# Patient Record
Sex: Female | Born: 1950 | Race: White | Hispanic: No | Smoking: Never smoker
Health system: Southern US, Community
[De-identification: ages and names within clinical notes are randomized; demographics above are authoritative.]

## PROBLEM LIST (undated history)

## (undated) DIAGNOSIS — I10 Essential (primary) hypertension: Secondary | ICD-10-CM

## (undated) HISTORY — PX: CHOLECYSTECTOMY: SHX55

## (undated) HISTORY — PX: KNEE SURGERY: SHX244

## (undated) HISTORY — PX: BLADDER SURGERY: SHX569

## (undated) HISTORY — PX: ABDOMINAL HYSTERECTOMY: SHX81

---

## 2020-05-23 ENCOUNTER — Other Ambulatory Visit: Payer: Self-pay

## 2020-05-23 ENCOUNTER — Emergency Department (HOSPITAL_COMMUNITY): Payer: Medicare Other

## 2020-05-23 ENCOUNTER — Inpatient Hospital Stay (HOSPITAL_COMMUNITY)
Admission: EM | Admit: 2020-05-23 | Discharge: 2020-05-28 | DRG: 177 | Disposition: A | Discharge status: Home / Self Care | Payer: Medicare Other | Attending: Family Medicine | Admitting: Family Medicine

## 2020-05-23 ENCOUNTER — Encounter (HOSPITAL_COMMUNITY): Payer: Self-pay | Admitting: Internal Medicine

## 2020-05-23 DIAGNOSIS — I10 Essential (primary) hypertension: Secondary | ICD-10-CM

## 2020-05-23 DIAGNOSIS — U071 COVID-19: Principal | ICD-10-CM

## 2020-05-23 DIAGNOSIS — E871 Hypo-osmolality and hyponatremia: Secondary | ICD-10-CM

## 2020-05-23 DIAGNOSIS — Z66 Do not resuscitate: Secondary | ICD-10-CM | POA: Diagnosis present

## 2020-05-23 DIAGNOSIS — R0602 Shortness of breath: Secondary | ICD-10-CM | POA: Diagnosis not present

## 2020-05-23 DIAGNOSIS — E213 Hyperparathyroidism, unspecified: Secondary | ICD-10-CM | POA: Diagnosis present

## 2020-05-23 DIAGNOSIS — I959 Hypotension, unspecified: Secondary | ICD-10-CM | POA: Diagnosis present

## 2020-05-23 DIAGNOSIS — J9601 Acute respiratory failure with hypoxia: Secondary | ICD-10-CM

## 2020-05-23 DIAGNOSIS — J1282 Pneumonia due to coronavirus disease 2019: Secondary | ICD-10-CM

## 2020-05-23 DIAGNOSIS — Z8249 Family history of ischemic heart disease and other diseases of the circulatory system: Secondary | ICD-10-CM | POA: Diagnosis not present

## 2020-05-23 LAB — CBC WITH DIFFERENTIAL/PLATELET
Abs Immature Granulocytes: 0.07 10*3/uL (ref 0.00–0.07)
Basophils Absolute: 0 10*3/uL (ref 0.0–0.1)
Basophils Relative: 0 %
Eosinophils Absolute: 0 10*3/uL (ref 0.0–0.5)
Eosinophils Relative: 0 %
HCT: 40.5 % (ref 36.0–46.0)
Hemoglobin: 13.1 g/dL (ref 12.0–15.0)
Immature Granulocytes: 1 %
Lymphocytes Relative: 18 %
Lymphs Abs: 1.3 10*3/uL (ref 0.7–4.0)
MCH: 29.3 pg (ref 26.0–34.0)
MCHC: 32.3 g/dL (ref 30.0–36.0)
MCV: 90.6 fL (ref 80.0–100.0)
Monocytes Absolute: 0.2 10*3/uL (ref 0.1–1.0)
Monocytes Relative: 3 %
Neutro Abs: 6 10*3/uL (ref 1.7–7.7)
Neutrophils Relative %: 78 %
Platelets: 164 10*3/uL (ref 150–400)
RBC: 4.47 MIL/uL (ref 3.87–5.11)
RDW: 13.2 % (ref 11.5–15.5)
Smear Review: NORMAL
WBC: 7.6 10*3/uL (ref 4.0–10.5)
nRBC: 0 % (ref 0.0–0.2)

## 2020-05-23 LAB — COMPREHENSIVE METABOLIC PANEL
ALT: 22 U/L (ref 0–44)
AST: 40 U/L (ref 15–41)
Albumin: 2.7 g/dL — ABNORMAL LOW (ref 3.5–5.0)
Alkaline Phosphatase: 65 U/L (ref 38–126)
Anion gap: 11 (ref 5–15)
BUN: 10 mg/dL (ref 8–23)
CO2: 22 mmol/L (ref 22–32)
Calcium: 8.1 mg/dL — ABNORMAL LOW (ref 8.9–10.3)
Chloride: 97 mmol/L — ABNORMAL LOW (ref 98–111)
Creatinine, Ser: 0.47 mg/dL (ref 0.44–1.00)
GFR calc Af Amer: >60 mL/min (ref >60)
GFR calc non Af Amer: >60 mL/min (ref >60)
Glucose, Bld: 107 mg/dL — ABNORMAL HIGH (ref 70–99)
Potassium: 4.1 mmol/L (ref 3.5–5.1)
Sodium: 130 mmol/L — ABNORMAL LOW (ref 135–145)
Total Bilirubin: 0.6 mg/dL (ref 0.3–1.2)
Total Protein: 6.6 g/dL (ref 6.5–8.1)

## 2020-05-23 LAB — D-DIMER, QUANTITATIVE: D-Dimer, Quant: 1.02 ug/mL-FEU — ABNORMAL HIGH (ref 0.00–0.50)

## 2020-05-23 LAB — LACTIC ACID, PLASMA: Lactic Acid, Venous: 0.9 mmol/L (ref 0.5–1.9)

## 2020-05-23 LAB — FERRITIN: Ferritin: 739 ng/mL — ABNORMAL HIGH (ref 11–307)

## 2020-05-23 LAB — SODIUM, URINE, RANDOM: Sodium, Ur: 10 mmol/L

## 2020-05-23 LAB — C-REACTIVE PROTEIN: CRP: 12 mg/dL — ABNORMAL HIGH (ref <1.0)

## 2020-05-23 LAB — OSMOLALITY, URINE: Osmolality, Ur: 118 mOsm/kg — ABNORMAL LOW (ref 300–900)

## 2020-05-23 LAB — PROCALCITONIN: Procalcitonin: 0.17 ng/mL

## 2020-05-23 LAB — CREATININE, URINE, RANDOM: Creatinine, Urine: 15.66 mg/dL

## 2020-05-23 LAB — FIBRINOGEN: Fibrinogen: 450 mg/dL (ref 210–475)

## 2020-05-23 LAB — TRIGLYCERIDES: Triglycerides: 54 mg/dL (ref <150)

## 2020-05-23 LAB — LACTATE DEHYDROGENASE: LDH: 262 U/L — ABNORMAL HIGH (ref 98–192)

## 2020-05-23 LAB — TROPONIN I (HIGH SENSITIVITY): Troponin I (High Sensitivity): 10 ng/L (ref <18)

## 2020-05-23 LAB — SARS CORONAVIRUS 2 BY RT PCR (HOSPITAL ORDER, PERFORMED IN ~~LOC~~ HOSPITAL LAB): SARS Coronavirus 2: POSITIVE — AB

## 2020-05-23 MED ORDER — GUAIFENESIN-DM 100-10 MG/5ML PO SYRP
10.0000 mL | ORAL_SOLUTION | ORAL | Status: DC | PRN
  Administered 2020-05-24 – 2020-05-26 (×3): 10 mL via ORAL
  Filled 2020-05-23 (×3): qty 10

## 2020-05-23 MED ORDER — LISINOPRIL 5 MG PO TABS
5.0000 mg | ORAL_TABLET | Freq: Every day | ORAL | Status: DC
  Administered 2020-05-24 – 2020-05-28 (×5): 5 mg via ORAL
  Filled 2020-05-23 (×7): qty 1

## 2020-05-23 MED ORDER — SODIUM CHLORIDE 0.9 % IV SOLN: 250.0000 mL | INTRAVENOUS | Status: DC | PRN

## 2020-05-23 MED ORDER — DEXAMETHASONE SODIUM PHOSPHATE 10 MG/ML IJ SOLN
10.0000 mg | Freq: Once | INTRAMUSCULAR | Status: AC
  Administered 2020-05-23: 10 mg via INTRAVENOUS
  Filled 2020-05-23: qty 1

## 2020-05-23 MED ORDER — SODIUM CHLORIDE 0.45 % IV SOLN: INTRAVENOUS | Status: DC

## 2020-05-23 MED ORDER — METHYLPREDNISOLONE SODIUM SUCC 40 MG IJ SOLR
0.5000 mg/kg | Freq: Two times a day (BID) | INTRAMUSCULAR | Status: DC
  Administered 2020-05-24: 34 mg via INTRAVENOUS
  Filled 2020-05-23: qty 1

## 2020-05-23 MED ORDER — SODIUM CHLORIDE 0.9% FLUSH: 3.0000 mL | Freq: Two times a day (BID) | INTRAVENOUS | Status: DC

## 2020-05-23 MED ORDER — ENOXAPARIN SODIUM 40 MG/0.4ML ~~LOC~~ SOLN
40.0000 mg | SUBCUTANEOUS | Status: DC
  Administered 2020-05-23 – 2020-05-27 (×5): 40 mg via SUBCUTANEOUS
  Filled 2020-05-23 (×5): qty 0.4

## 2020-05-23 MED ORDER — ONDANSETRON HCL 4 MG/2ML IJ SOLN
4.0000 mg | Freq: Four times a day (QID) | INTRAMUSCULAR | Status: DC | PRN
  Administered 2020-05-24: 4 mg via INTRAVENOUS
  Filled 2020-05-23: qty 2

## 2020-05-23 MED ORDER — SODIUM CHLORIDE 0.9 % IV SOLN
100.0000 mg | Freq: Every day | INTRAVENOUS | Status: AC
  Administered 2020-05-24 – 2020-05-27 (×4): 100 mg via INTRAVENOUS
  Filled 2020-05-23 (×5): qty 20

## 2020-05-23 MED ORDER — SODIUM CHLORIDE 0.9 % IV SOLN
200.0000 mg | Freq: Once | INTRAVENOUS | Status: AC
  Administered 2020-05-23: 200 mg via INTRAVENOUS
  Filled 2020-05-23: qty 40

## 2020-05-23 MED ORDER — ONDANSETRON HCL 4 MG PO TABS: 4.0000 mg | ORAL_TABLET | Freq: Four times a day (QID) | ORAL | Status: DC | PRN

## 2020-05-23 MED ORDER — SODIUM CHLORIDE 0.9% FLUSH
3.0000 mL | Freq: Two times a day (BID) | INTRAVENOUS | Status: DC
  Administered 2020-05-24: 3 mL via INTRAVENOUS

## 2020-05-23 MED ORDER — SODIUM CHLORIDE 0.9 % IV SOLN
1000.0000 mL | INTRAVENOUS | Status: DC
  Administered 2020-05-24: 1000 mL via INTRAVENOUS

## 2020-05-23 MED ORDER — SODIUM CHLORIDE 0.9 % IV SOLN
1000.0000 mL | INTRAVENOUS | Status: DC
  Administered 2020-05-23: 1000 mL via INTRAVENOUS

## 2020-05-23 MED ORDER — ACETAMINOPHEN 325 MG PO TABS
650.0000 mg | ORAL_TABLET | Freq: Four times a day (QID) | ORAL | Status: DC | PRN
  Administered 2020-05-25: 650 mg via ORAL
  Filled 2020-05-23: qty 2

## 2020-05-23 MED ORDER — SODIUM CHLORIDE 0.9% FLUSH: 3.0000 mL | INTRAVENOUS | Status: DC | PRN

## 2020-05-23 MED ORDER — PREDNISONE 20 MG PO TABS: 50.0000 mg | ORAL_TABLET | Freq: Every day | ORAL | Status: DC

## 2020-05-23 NOTE — ED Triage Notes (Signed)
Pt states she was started having Covid s/s last Mon and Wed was dx with Covid.  She received the monoclonal antibody infusion yesterday and today her sats dropped below 90%, so she came in.  Sats of 88% on arrival that increased to 94% on 2L.

## 2020-05-23 NOTE — H&P (Signed)
Margaret Mclaughlin ZOX:[id number] DOB: 1950-12-30 DOA: 05/23/2020     PCP: Oneita Hurt, No   Outpatient Specialists:  NONE   Patient arrived to ER on 05/23/20 at 1410 Referred by Attending Jacalyn Lefevre, MD   Patient coming from: home Lives alone,       Chief Complaint:   Chief Complaint  Patient presents with   Shortness of Breath    Covid pos    HPI: Margaret Mclaughlin is a 69 y.o. female with medical history significant of HTN    Presented with  Started to have URI symptoms 7 days ago, 5 days ago was diagnosed with COVID. Had monoclonal abs infusion yesterday at [place] Today hypoxic down to 85% in AM Reports mild nausea no diarrhea no abdominal pain Infectious risk factors:  Reports   fever, shortness of breath, dry cough,    KNOWN COVID POSITIVE   Has NOt been vaccinated against COVID would be interested when gets better   Initial COVID TEST    POSITIVE,       Lab Results  Component Value Date   SARSCOV2NAA POSITIVE (A) 05/23/2020   Regarding pertinent Chronic problems:     HTN on lisinopril    While in ER: Started on o2 2L now up to mid 90's   Hospitalist was called for admission for COVID pneumonia with acute respiratory failure with hypoxia  The following Work up has been ordered so far:  Orders Placed This Encounter  Procedures   SARS Coronavirus 2 by RT PCR (hospital order, performed in [facility] hospital lab) Nasopharyngeal Nasopharyngeal Swab   Blood Culture (routine x 2)   DG Chest Port 1 View   Lactic acid, plasma   CBC WITH DIFFERENTIAL   Comprehensive metabolic panel   D-dimer, quantitative   Procalcitonin   Lactate dehydrogenase   Ferritin   Triglycerides   Fibrinogen   C-reactive protein   Diet regular Room service appropriate? Yes; Fluid consistency: Thin   Cardiac monitoring   Insert peripheral IV x 2   Initiate Carrier Fluid Protocol   Place surgical mask on patient   Patient to wear surgical mask during transportation     Assess patient for ability to self-prone. If able (can move self in bed, ambulate) and stable (SpO2 and oxygen requirement):   RN/NT - Document specific oxygen requirements in [facility]   Notify EDP if new oxygen requirements escalates > 4L per minute [place]   RN to draw the following extra tubes:   Pharmacy Tech to prioritize PTA Med Rec   remdesivir per pharmacy consult   Consult for Unassigned Medical Admission  ALL PATIENTS BEING ADMITTED/HAVING PROCEDURES NEED COVID-19 SCREENING   Airborne and Contact precautions   Pulse oximetry, continuous   ED EKG 12-Lead   EKG 12-Lead   Following Medications were ordered in ER: Medications  remdesivir 200 mg in sodium chloride 0.9% 250 mL IVPB (has no administration in time range)    Followed by  remdesivir 100 mg in sodium chloride 0.9 % 100 mL IVPB (has no administration in time range)  0.9 %  sodium chloride infusion (has no administration in time range)  dexamethasone (DECADRON) injection 10 mg (10 mg Intravenous Given 05/23/20 1636)    Significant initial  Findings: Abnormal Labs Reviewed  SARS CORONAVIRUS 2 BY RT PCR (HOSPITAL ORDER, PERFORMED IN [place] [facility]) - Abnormal; Notable for the following components:      Result Value   SARS Coronavirus 2 POSITIVE (*)  All other components within normal limits  COMPREHENSIVE METABOLIC PANEL - Abnormal; Notable for the following components:   Sodium 130 (*)    Chloride 97 (*)    Glucose, Bld 107 (*)    Calcium 8.1 (*)    Albumin 2.7 (*)    All other components within normal limits  D-DIMER, QUANTITATIVE (NOT AT [facility]) - Abnormal; Notable for the following components:   D-Dimer, Quant 1.02 (*)    All other components within normal limits  LACTATE DEHYDROGENASE - Abnormal; Notable for the following components:   LDH 262 (*)    All other components within normal limits  FERRITIN - Abnormal; Notable for the following components:   Ferritin 739 (*)    All other components  within normal limits  C-REACTIVE PROTEIN - Abnormal; Notable for the following components:   CRP 12.0 (*)    All other components within normal limits   Otherwise labs showing:    Recent Labs  Lab 05/23/20 1611  NA 130*  K 4.1  CO2 22  GLUCOSE 107*  BUN 10  CREATININE 0.47  CALCIUM 8.1*    Cr  stable,    Lab Results  Component Value Date   CREATININE 0.47 05/23/2020    Recent Labs  Lab 05/23/20 1611  AST 40  ALT 22  ALKPHOS 65  BILITOT 0.6  PROT 6.6  ALBUMIN 2.7*   Lab Results  Component Value Date   CALCIUM 8.1 (L) 05/23/2020      WBC     Component Value Date/Time   WBC 7.6 05/23/2020 1611   LYMPHSABS PENDING 05/23/2020 1611   MONOABS PENDING 05/23/2020 1611   EOSABS PENDING 05/23/2020 1611   BASOSABS PENDING 05/23/2020 1611   Plt: Lab Results  Component Value Date   PLT 164 05/23/2020    Lactic Acid, Venous    Component Value Date/Time   LATICACIDVEN 0.9 05/23/2020 1610     Procalcitonin 0.17   COVID-19 Labs  Recent Labs    05/23/20 1611  DDIMER 1.02*  FERRITIN 739*  LDH 262*  CRP 12.0*    Lab Results  Component Value Date   SARSCOV2NAA POSITIVE (A) 05/23/2020    HG/HCT   stable,      Component Value Date/Time   HGB 13.1 05/23/2020 1611   HCT 40.5 05/23/2020 1611   MCV 90.6 05/23/2020 1611    Troponin 10 Cardiac Panel (last 3 results)   ECG: Ordered Personally reviewed by me showing: HR : 79 Rhythm:   NSR,   no evidence of ischemic changes QTC 459     Ordered    CXR - bilateral infiltrates     ED Triage Vitals  Enc Vitals Group     BP 05/23/20 1600 (!) 138/97     Pulse Rate 05/23/20 1500 78     Resp 05/23/20 1600 (!) 23     Temp 05/23/20 1551 98.6 F (37 C)     Temp Source 05/23/20 1551 Oral     SpO2 05/23/20 1500 93 %     Weight 05/23/20 1552 150 lb (68 kg)     Height 05/23/20 1552 5\' 1"  (1.549 m)     Head Circumference --      Peak Flow --      Pain Score 05/23/20 1552 0     Pain Loc --      Pain  Edu? --      Excl. in GC? --   05/25/20  Latest  Blood pressure (!) 138/97, pulse 77, temperature 98.6 F (37 C), temperature source Oral, resp. rate (!) 23, height 5\' 1"  (1.549 m), weight 68 kg, SpO2 92 %.     Review of Systems:    Pertinent positives include: Fevers, chills, fatigue, shortness of breath at rest.  dyspnea on exertion,  Constitutional:  No weight loss, night sweats weight loss  HEENT:  No headaches, Difficulty swallowing,Tooth/dental problems,Sore throat,  No sneezing, itching, ear ache, nasal congestion, post nasal drip,  Cardio-vascular:  No chest pain, Orthopnea, PND, anasarca, dizziness, palpitations. no Bilateral lower extremity swelling  GI:  No heartburn, indigestion, abdominal pain, nausea, vomiting, diarrhea, change in bowel habits, loss of appetite, melena, blood in stool, hematemesis Resp:  no  No excess mucus, no productive cough, No non-productive cough, No coughing up of blood.No change in color of mucus.No wheezing. Skin:  no rash or lesions. No jaundice GU:  no dysuria, change in color of urine, no urgency or frequency. No straining to urinate.  No flank pain.  Musculoskeletal:  No joint pain or no joint swelling. No decreased range of motion. No back pain.  Psych:  No change in mood or affect. No depression or anxiety. No memory loss.  Neuro: no localizing neurological complaints, no tingling, no weakness, no double vision, no gait abnormality, no slurred speech, no confusion  All systems reviewed and apart from HOPI all are negative  Past Medical History:  History reviewed. No pertinent past medical history.    History reviewed. No pertinent surgical history.  Social History:  Ambulatory  independently      has no history on file for tobacco use, alcohol use, and drug use.   Family History:   Family History  Problem Relation Age of Onset   Hypertension Other     Allergies: Allergies  Allergen Reactions   Gluten Meal  Nausea And Vomiting   Hydrocodone Nausea Only   Oxycodone Nausea Only     Prior to Admission medications   Not on File   Physical Exam: Vitals with BMI 05/23/2020 05/23/2020 05/23/2020  Height - 5\' 1"  -  Weight - 150 lbs -  BMI - 28.36 -  Systolic 138 - -  Diastolic 97 - -  Pulse 77 - 78    1. General:  in No Acute distress   Chronically ill -appearing 2. Psychological: Alert and  Oriented 3. Head/ENT:   Dry Mucous Membranes                          Head Non traumatic, neck supple                           Poor Dentition 4. SKIN:   decreased Skin turgor,  Skin clean Dry and intact no rash 5. Heart: Regular rate and rhythm no  Murmur, no Rub or gallop 6. Lungs:  no wheezes or crackles   7. Abdomen: Soft,  non-tender, Non distended  bowel sounds present 8. Lower extremities: no clubbing, cyanosis, no  edema 9. Neurologically Grossly intact, moving all 4 extremities equally   10. MSK: Normal range of motion   All other LABS:     Recent Labs  Lab 05/23/20 1611  WBC 7.6  NEUTROABS PENDING  HGB 13.1  HCT 40.5  MCV 90.6  PLT 164     Recent Labs  Lab 05/23/20 1611  NA 130*  K 4.1  CL 97*  CO2 22  GLUCOSE 107*  BUN 10  CREATININE 0.47  CALCIUM 8.1*     Recent Labs  Lab 05/23/20 1611  AST 40  ALT 22  ALKPHOS 65  BILITOT 0.6  PROT 6.6  ALBUMIN 2.7*       Cultures: No results found for: SDES, SPECREQUEST, CULT, REPTSTATUS   Radiological Exams on Admission: DG Chest Port 1 View  Result Date: 05/23/2020 CLINICAL DATA:  Shortness of breath.  COVID positive. EXAM: PORTABLE CHEST 1 VIEW COMPARISON:  None. FINDINGS: There are diffuse bilateral ground-glass airspace opacities. No pneumothorax. No significant pleural effusion. There is cardiac enlargement with probable atherosclerotic changes of the thoracic aorta. There is an old healed fracture of the mid right clavicle. There is a calcification in the left upper quadrant favored to represent a calcified and  tortuous splenic artery. IMPRESSION: Diffuse bilateral ground-glass airspace opacities consistent with the patient's history of viral pneumonia. Electronically Signed   By: Katherine Mantlehristopher  Green M.D.   On: 05/23/2020 15:29    Chart has been reviewed   Assessment/Plan  69 y.o. female with medical history significant of HTN  Admitted for COVID PNA  Present on Admission:  COVID-19 virus infection -  FROM HOME   WITH KNOWN HX OF COVID19 ER Novel Corona Virus testing: Ordered 05/23/20 and is   positive  Immunization status: not immunized      CRP, LDH: increased   IL-6 and Ferritin increased   Procalcitonin: low  CXR: hazy bilateral peripheral opacities      -Following work-up initiated:      sputum cultures Ordered 05/23/20, Blood cultures  Ordered 05/23/20,      Following complications noted:   Nausea  decreased PO intake resulting in dehydration - will rehydrate  acute respiratory failure with hypoxia - continue oxygen treatment  Plan of treatment: Admit on Airborn Precautions  -given severity of illness initiate steroids Decadron 6mg  q 24 hours And pharmacy consult for remdesivir -  IF Hypoxia progresses rapidly  or  requiring high nasal flow   and no contraindications will attempt a trial of Baricitinib or Actemra   -  use of baricitinib discussed with patient-they are aware this is under EUA by FDA-she has no history of TB, hepatitis B, diverticulitis.  patient is aware of the risk of opportunistic infection, VTE.  They consent to the use of baricitinib.  - Will follow daily d.dimer - Assess for ability to prone  - Supportive management -Fluid sparing resuscitation  -Provide oxygen as needed currently on 2L SpO2: 93 % - IF d.dimer elvated >5 will increase dose of lovenox    Poor Prognostic factors  69 y.o.  Personal hx of  HTN,  NON-Vaccinated status  ABS neutrophil to lymphocyte ratio >3.5 Some Risk factors for Cytokine storm  CXR GGO Ferritin >250 CRP >4.6    present   Will order Airborne and Contact precautions  Family/ patient prognosis discussion: I have discussed case with the family/ patient  who are aware of their prognosis At this point they would like  to be  DNR/DNI       The treatment plan and use of medications and known side effects were discussed with patient/ family. It was clearly explained that there is no proven definitive treatment for COVID-19 infection yet. Any medications used here are based on case reports/anecdotal data which are not peer-reviewed and has not been studied using randomized control trials.  Complete risks and long-term side effects are unknown,  however in the best clinical judgment they seem to be of some clinical benefit rather than medical risks.  Patient/ family agree with the treatment plan and want to receive these treatments as indicated.       Essential hypertension  - restart lisinopril    Hyperparathyroidism (HCC) -chronic     Hyponatremia - obtain urine lytes, gently rehydrate   Other plan as per orders.  DVT prophylaxis:  Lovenox       Code Status:    Code Status: DNR  DNR/DNI   as per patient   I had personally discussed CODE STATUS with patient    Family Communication:   Family not at  Bedside  plan of care was discussed on the phone with  Daughter,   Disposition Plan:      To home once workup is complete and patient is stable   Following barriers for discharge:                            Electrolytes corrected                                                         Will likely need home health, home O2, set up                          Hypoxia stable                      Consults called: none  Admission status:  ED Disposition    ED Disposition Condition Comment   Admit  Hospital Area[facility] [[id number]]  Level of Care: Telemetry Medical [[id number]]  May admit patient to [facility] or [facility] if equivalent level of care is available:: No  Covid Evaluation: Confirmed  COVID Positive  Diagnosis: Pneumonia due to COVID-19 virus [[id number]]  Admitting Physician: Therisa Doyne [[id number]]  Attending Physician: Therisa Doyne [[id number]]  Estimated length of stay: past midnight tomorrow  Certification:: I certify this patient will need inpatient services for at least 2 midnights       inpatient     I Expect 2 midnight stay secondary to severity of patient's current illness need for inpatient interventions justified by the following:  hemodynamic instability despite optimal treatment (tachycardia *hypotension * tachypnea  Hypoxia )   Severe lab/radiological/exam abnormalities including:    bilateral PNA     That are currently affecting medical management.   I expect  patient to be hospitalized for 2 midnights requiring inpatient medical care.  Patient is at high risk for adverse outcome (such as loss of life or disability) if not treated.  Indication for inpatient stay as follows:    Hemodynamic instability despite maximal medical therapy,    New or worsening hypoxia  Need for IV antivirals , IV steroids    Level of care     Tele indefinitely please discontinue once patient no longer qualifies COVID-19 Labs   Lab Results  Component Value Date   SARSCOV2NAA POSITIVE (A) 05/23/2020     Precautions: admitted as   covid positive Airborne and Contact precautions   PPE: Used by the provider:   P100  eye Goggles,  Gloves  gown  Mekai Wilkinson 05/23/2020, 11:23 PM    [facility]     after 2 AM please page floor coverage PA If 7AM-7PM, please contact the day team taking care of the patient using [email or web address]   Patient was evaluated in the context of the global COVID-19 pandemic, which necessitated consideration that the patient might be at risk for infection with the SARS-CoV-2 virus that causes COVID-19. Institutional protocols and algorithms that pertain to the evaluation of patients at risk for COVID-19 are in a state of  rapid change based on information released by regulatory bodies including the CDC and federal and state organizations. These policies and algorithms were followed during the patient's care.

## 2020-05-23 NOTE — ED Provider Notes (Signed)
[facility] EMERGENCY DEPARTMENT Provider Note   CSN: [id number] Arrival date & time: 05/23/20  1410     History Chief Complaint  Patient presents with  . Shortness of Breath    Covid pos    Margaret Mclaughlin is a 69 y.o. female.  Pt presents to the ED today with sob.  Pt started having sx of Covid on 9/13.  She was tested on 9/15 and was Covid +.  She has not been vaccinated.  She has experienced worsening sob.  Pt has a pulse oximeter at home which showed her O2 sats to be in the mid-80s.  When she arrived here, she had a RA O2 sat at 88%.  Pt placed on 2L which brought her up to mid-90s.  Pt did get the Mab infusion in [place] yesterday.        Pmhx: Past Medical History:  Diagnosis Date  . Hyperparathyroidism (HCC)  . Hypertension      OB History   No obstetric history on file.     History reviewed. No pertinent family history.  Family History  Problem Relation Age of Onset  . Heart disease Sister  . Scoliosis Sister  . Heart disease Brother  . Cancer Brother  . Heart disease Sister  . Heart disease Sister  . Cancer Brother    Social Hx: Never smoker.  No etoh  Home Medications Prior to Admission medications   Not on File  Current Outpatient Medications  Medication Sig Dispense Refill  . Bacillus coagulans (PROBIOTIC, B. COAGULANS,) 10 billion cell CpDR Take by mouth.  . calcium carbonate (TUMS) 200 mg calcium (500 mg) chewable tablet Take 1 tablet by mouth.  [name] CALCIUM/MAGNESIUM/VIT D3 (CALCIUM CARB,CIT-MAG12-VIT D3 ORAL) Take by mouth.  . mv,Ca,min-FA-K1-lycopene-lutn 400-30 mcg Tab Take by mouth.  . prednisoLONE acetate (PRED FORTE) 1 % ophthalmic suspension 1 drop every Monday, Wednesday and Friday in both eyes  . conjugated estrogens (PREMARIN) 0.625 mg/gram vaginal cream 0.5 grams twice a week at bedtime 30 g 3  . lisinopriL (PRINIVIL,ZESTRIL) 5 MG tablet TAKE 1 TABLET BY MOUTH EVERY DAY   Past Surgical History:  Procedure  Laterality Date  . CATARACT EXTRACTION, BILATERAL  . CHOLECYSTECTOMY  . CORNEAL TRANSPLANT Bilateral  . HYSTERECTOMY  . LEG SURGERY  . WRIST SURGERY Left    Allergies    Gluten meal, Hydrocodone, and Oxycodone  Review of Systems   Review of Systems  Constitutional: Positive for fatigue.  Respiratory: Positive for cough and shortness of breath.   All other systems reviewed and are negative.   Physical Exam Updated Vital Signs BP (!) 138/97   Pulse 77   Temp 98.6 F (37 C) (Oral)   Resp (!) 23   Ht 5\' 1"  (1.549 m)   Wt 68 kg   SpO2 92%   BMI 28.34 kg/m   Physical Exam Vitals and nursing note reviewed.  Constitutional:      Appearance: Normal appearance.  HENT:     Head: Normocephalic and atraumatic.     Right Ear: External ear normal.     Left Ear: External ear normal.     Nose: Nose normal.     Mouth/Throat:     Mouth: Mucous membranes are moist.     Pharynx: Oropharynx is clear.  Eyes:     Extraocular Movements: Extraocular movements intact.     Conjunctiva/sclera: Conjunctivae normal.     Pupils: Pupils are equal, round, and reactive to light.  Cardiovascular:  Rate and Rhythm: Normal rate and regular rhythm.     Pulses: Normal pulses.     Heart sounds: Normal heart sounds.  Pulmonary:     Effort: Pulmonary effort is normal.     Breath sounds: Rhonchi present.  Abdominal:     General: Abdomen is flat. Bowel sounds are normal.     Palpations: Abdomen is soft.  Musculoskeletal:        General: Normal range of motion.     Cervical back: Normal range of motion and neck supple.  Skin:    General: Skin is warm.     Capillary Refill: Capillary refill takes less than 2 seconds.  Neurological:     General: No focal deficit present.     Mental Status: She is alert and oriented to person, place, and time.  Psychiatric:        Mood and Affect: Mood normal.        Behavior: Behavior normal.        Thought Content: Thought content normal.        Judgment:  Judgment normal.     ED Results / Procedures / Treatments   Labs (all labs ordered are listed, but only abnormal results are displayed) Labs Reviewed  SARS CORONAVIRUS 2 BY RT PCR (HOSPITAL ORDER, PERFORMED IN [facility]) - Abnormal; Notable for the following components:      Result Value   SARS Coronavirus 2 POSITIVE (*)    All other components within normal limits  COMPREHENSIVE METABOLIC PANEL - Abnormal; Notable for the following components:   Sodium 130 (*)    Chloride 97 (*)    Glucose, Bld 107 (*)    Calcium 8.1 (*)    Albumin 2.7 (*)    All other components within normal limits  D-DIMER, QUANTITATIVE (NOT AT [facility]) - Abnormal; Notable for the following components:   D-Dimer, Quant 1.02 (*)    All other components within normal limits  LACTATE DEHYDROGENASE - Abnormal; Notable for the following components:   LDH 262 (*)    All other components within normal limits  FERRITIN - Abnormal; Notable for the following components:   Ferritin 739 (*)    All other components within normal limits  C-REACTIVE PROTEIN - Abnormal; Notable for the following components:   CRP 12.0 (*)    All other components within normal limits  CULTURE, BLOOD (ROUTINE X 2)  CULTURE, BLOOD (ROUTINE X 2)  LACTIC ACID, PLASMA  CBC WITH DIFFERENTIAL/PLATELET  PROCALCITONIN  TRIGLYCERIDES  FIBRINOGEN  LACTIC ACID, PLASMA  TROPONIN I (HIGH SENSITIVITY)    EKG None  Radiology DG Chest Port 1 View  Result Date: 05/23/2020 CLINICAL DATA:  Shortness of breath.  COVID positive. EXAM: PORTABLE CHEST 1 VIEW COMPARISON:  None. FINDINGS: There are diffuse bilateral ground-glass airspace opacities. No pneumothorax. No significant pleural effusion. There is cardiac enlargement with probable atherosclerotic changes of the thoracic aorta. There is an old healed fracture of the mid right clavicle. There is a calcification in the left upper quadrant favored to represent a calcified and tortuous  splenic artery. IMPRESSION: Diffuse bilateral ground-glass airspace opacities consistent with the patient's history of viral pneumonia. Electronically Signed   By: Katherine Mantle M.D.   On: 05/23/2020 15:29    Procedures Procedures (including critical care time)  Medications Ordered in ED Medications  remdesivir 200 mg in sodium chloride 0.9% 250 mL IVPB (has no administration in time range)    Followed by  remdesivir 100  mg in sodium chloride 0.9 % 100 mL IVPB (has no administration in time range)  0.9 %  sodium chloride infusion (1,000 mLs Intravenous New Bag/Given 05/23/20 1859)  dexamethasone (DECADRON) injection 10 mg (10 mg Intravenous Given 05/23/20 1636)    ED Course  I have reviewed the triage vital signs and the nursing notes.  Pertinent labs & imaging results that were available during my care of the patient were reviewed by me and considered in my medical decision making (see chart for details).    MDM Rules/Calculators/A&P                         Pt is requiring O2 and will need admission.  Pt d/w Dr. Adela Glimpse ([facility]) for admission.  CRITICAL CARE Performed by: Jacalyn Lefevre   Total critical care time: 30 minutes  Critical care time was exclusive of separately billable procedures and treating other patients.  Critical care was necessary to treat or prevent imminent or life-threatening deterioration.  Critical care was time spent personally by me on the following activities: development of treatment plan with patient and/or surrogate as well as nursing, discussions with consultants, evaluation of patient's response to treatment, examination of patient, obtaining history from patient or surrogate, ordering and performing treatments and interventions, ordering and review of laboratory studies, ordering and review of radiographic studies, pulse oximetry and re-evaluation of patient's condition.  Margaret Mclaughlin was evaluated in Emergency Department on 05/23/2020 for  the symptoms described in the history of present illness. She was evaluated in the context of the global COVID-19 pandemic, which necessitated consideration that the patient might be at risk for infection with the SARS-CoV-2 virus that causes COVID-19. Institutional protocols and algorithms that pertain to the evaluation of patients at risk for COVID-19 are in a state of rapid change based on information released by regulatory bodies including the CDC and federal and state organizations. These policies and algorithms were followed during the patient's care in the ED. Final Clinical Impression(s) / ED Diagnoses Final diagnoses:  Pneumonia due to COVID-19 virus  Acute respiratory failure with hypoxia [facility])    Rx / DC Orders ED Discharge Orders    None       Jacalyn Lefevre, MD 05/23/20 1910

## 2020-05-24 ENCOUNTER — Other Ambulatory Visit: Payer: Self-pay

## 2020-05-24 LAB — CBC WITH DIFFERENTIAL/PLATELET
Abs Immature Granulocytes: 0.06 10*3/uL (ref 0.00–0.07)
Basophils Absolute: 0 10*3/uL (ref 0.0–0.1)
Basophils Relative: 0 %
Eosinophils Absolute: 0 10*3/uL (ref 0.0–0.5)
Eosinophils Relative: 0 %
HCT: 42.6 % (ref 36.0–46.0)
Hemoglobin: 13.7 g/dL (ref 12.0–15.0)
Immature Granulocytes: 1 %
Lymphocytes Relative: 21 %
Lymphs Abs: 1.3 10*3/uL (ref 0.7–4.0)
MCH: 29.3 pg (ref 26.0–34.0)
MCHC: 32.2 g/dL (ref 30.0–36.0)
MCV: 91.2 fL (ref 80.0–100.0)
Monocytes Absolute: 0.1 10*3/uL (ref 0.1–1.0)
Monocytes Relative: 2 %
Neutro Abs: 4.9 10*3/uL (ref 1.7–7.7)
Neutrophils Relative %: 76 %
Platelets: 177 10*3/uL (ref 150–400)
RBC: 4.67 MIL/uL (ref 3.87–5.11)
RDW: 13.1 % (ref 11.5–15.5)
WBC: 6.4 10*3/uL (ref 4.0–10.5)
nRBC: 0 % (ref 0.0–0.2)

## 2020-05-24 LAB — COMPREHENSIVE METABOLIC PANEL
ALT: 25 U/L (ref 0–44)
AST: 42 U/L — ABNORMAL HIGH (ref 15–41)
Albumin: 2.6 g/dL — ABNORMAL LOW (ref 3.5–5.0)
Alkaline Phosphatase: 62 U/L (ref 38–126)
Anion gap: 9 (ref 5–15)
BUN: 12 mg/dL (ref 8–23)
CO2: 25 mmol/L (ref 22–32)
Calcium: 8.4 mg/dL — ABNORMAL LOW (ref 8.9–10.3)
Chloride: 103 mmol/L (ref 98–111)
Creatinine, Ser: 0.63 mg/dL (ref 0.44–1.00)
GFR calc Af Amer: >60 mL/min (ref >60)
GFR calc non Af Amer: >60 mL/min (ref >60)
Glucose, Bld: 137 mg/dL — ABNORMAL HIGH (ref 70–99)
Potassium: 4.6 mmol/L (ref 3.5–5.1)
Sodium: 137 mmol/L (ref 135–145)
Total Bilirubin: 0.5 mg/dL (ref 0.3–1.2)
Total Protein: 6.7 g/dL (ref 6.5–8.1)

## 2020-05-24 LAB — PROCALCITONIN: Procalcitonin: 0.11 ng/mL

## 2020-05-24 LAB — FERRITIN: Ferritin: 843 ng/mL — ABNORMAL HIGH (ref 11–307)

## 2020-05-24 LAB — BRAIN NATRIURETIC PEPTIDE: B Natriuretic Peptide: 108.8 pg/mL — ABNORMAL HIGH (ref 0.0–100.0)

## 2020-05-24 LAB — ABO/RH: ABO/RH(D): O POS

## 2020-05-24 LAB — D-DIMER, QUANTITATIVE: D-Dimer, Quant: 0.99 ug/mL-FEU — ABNORMAL HIGH (ref 0.00–0.50)

## 2020-05-24 LAB — C-REACTIVE PROTEIN: CRP: 10.3 mg/dL — ABNORMAL HIGH (ref <1.0)

## 2020-05-24 LAB — GLUCOSE, CAPILLARY: Glucose-Capillary: 131 mg/dL — ABNORMAL HIGH (ref 70–99)

## 2020-05-24 LAB — CBG MONITORING, ED
Glucose-Capillary: 147 mg/dL — ABNORMAL HIGH (ref 70–99)
Glucose-Capillary: 127 mg/dL — ABNORMAL HIGH (ref 70–99)

## 2020-05-24 LAB — TROPONIN I (HIGH SENSITIVITY): Troponin I (High Sensitivity): 12 ng/L (ref <18)

## 2020-05-24 LAB — MAGNESIUM: Magnesium: 2 mg/dL (ref 1.7–2.4)

## 2020-05-24 MED ORDER — BARICITINIB 2 MG PO TABS
4.0000 mg | ORAL_TABLET | Freq: Every day | ORAL | Status: DC
  Administered 2020-05-24 – 2020-05-28 (×5): 4 mg via ORAL
  Filled 2020-05-24 (×5): qty 2

## 2020-05-24 MED ORDER — METHYLPREDNISOLONE SODIUM SUCC 40 MG IJ SOLR
20.0000 mg | Freq: Once | INTRAMUSCULAR | Status: AC
  Administered 2020-05-24: 20 mg via INTRAVENOUS
  Filled 2020-05-24: qty 1

## 2020-05-24 MED ORDER — METHYLPREDNISOLONE SODIUM SUCC 125 MG IJ SOLR
60.0000 mg | Freq: Two times a day (BID) | INTRAMUSCULAR | Status: DC
  Administered 2020-05-24 – 2020-05-28 (×8): 60 mg via INTRAVENOUS
  Filled 2020-05-24 (×8): qty 2

## 2020-05-24 MED ORDER — INSULIN ASPART 100 UNIT/ML ~~LOC~~ SOLN: 0.0000 [IU] | Freq: Every day | SUBCUTANEOUS | Status: DC

## 2020-05-24 MED ORDER — INSULIN ASPART 100 UNIT/ML ~~LOC~~ SOLN
0.0000 [IU] | Freq: Three times a day (TID) | SUBCUTANEOUS | Status: DC
  Administered 2020-05-24 – 2020-05-25 (×5): 1 [IU] via SUBCUTANEOUS
  Administered 2020-05-26: 2 [IU] via SUBCUTANEOUS
  Administered 2020-05-26 – 2020-05-28 (×4): 1 [IU] via SUBCUTANEOUS

## 2020-05-24 MED ORDER — SODIUM CHLORIDE 0.9 % IV SOLN
1000.0000 mL | INTRAVENOUS | Status: AC
  Administered 2020-05-24: 1000 mL via INTRAVENOUS

## 2020-05-24 MED ORDER — LIP MEDEX EX OINT
TOPICAL_OINTMENT | CUTANEOUS | Status: DC | PRN
  Filled 2020-05-24: qty 7

## 2020-05-24 NOTE — Progress Notes (Signed)
PROGRESS NOTE                                                                                                                                                                                                             Patient Demographics:    Margaret Mclaughlin, is a 69 y.o. female, DOB - [birth year], BJY:[id number]  Outpatient Primary MD for the patient is Pcp, No    LOS - 1  Admit date - 05/23/2020    Chief Complaint  Patient presents with   Shortness of Breath    Covid pos       Brief Narrative - Margaret Mclaughlin is a 69 y.o. female with medical history significant of HTN, who is unfortunately non Vaccinated and developed symptoms of illness about 1 weeks ago, got outpt Infusion but did not show any signs of improvement, continued to be more short of breath and came to the ER where she was diagnosed with acute hypoxic respiratory failure due to COVID-19 pneumonia and she was admitted.   Subjective:    [name] today has, No headache, No chest pain, No abdominal pain - No Nausea, No new weakness tingling or numbness, +ve Cough and SOB.   Assessment  & Plan :     1. Acute Hypoxic Resp. Failure due to Acute Covid 19 Viral Pneumonitis during the ongoing 2020 Covid 19 Pandemic - she is unfortunately not vaccinated and has so far incurred moderate to severe parenchymal lung injury due to COVID-19 pneumonia and inflammation, she has been started on high-dose IV steroids along with remdesivir and Baricitinib after appropriate consent.  Overall she is tenuous and would need close monitoring.  Encouraged the patient to sit up in chair in the daytime use I-S and flutter valve for pulmonary toiletry and then prone in bed when at night.  Will advance activity and titrate down oxygen as possible.  Actemra/Baricitinib  off label use - patient was told that if COVID-19 pneumonitis gets worse we might potentially use Actemra off label,  patient denies any known history of active diverticulitis, tuberculosis or hepatitis, understands the risks and benefits and wants to proceed with Actemra treatment if required.   SpO2: 91 % O2 Flow Rate (L/min): 5 L/min  Recent Labs  Lab 05/23/20 1610 05/23/20 1611 05/24/20 0422 05/24/20 0704  WBC  --  7.6  6.4  --   PLT  --  164 177  --   CRP  --  12.0* 10.3*  --   BNP  --   --   --  108.8*  DDIMER  --  1.02* 0.99*  --   PROCALCITON  --  0.17  --  0.11  AST  --  40 42*  --   ALT  --  22 25  --   ALKPHOS  --  65 62  --   BILITOT  --  0.6 0.5  --   ALBUMIN  --  2.7* 2.6*  --   LATICACIDVEN 0.9  --   --   --   SARSCOV2NAA POSITIVE*  --   --   --     2.  Essential hypertension currently on ACE inhibitor we will continue to monitor.   3.  Chronic parathyroid issues per chart, calcium levels unremarkable we will continue to intermittently monitor with outpatient endocrine PCP follow-up as desired.     Condition - Extremely Guarded  Family Communication  :  Daughter Amy v on 05/24/20  Code Status :  Full  Consults  :  None  Procedures  :  None  PUD Prophylaxis : None  Disposition Plan  :    Status is: Inpatient  Remains inpatient appropriate because:IV treatments appropriate due to intensity of illness or inability to take PO   Dispo: The patient is from: Home              Anticipated d/c is to: Home              Anticipated d/c date is: > 3 days              Patient currently is not medically stable to d/c.   DVT Prophylaxis  :  Lovenox   Lab Results  Component Value Date   PLT 177 05/24/2020    Diet :  Diet Order            Diet Heart Room service appropriate? Yes; Fluid consistency: Thin  Diet effective now                  Inpatient Medications  Scheduled Meds:  baricitinib  4 mg Oral Daily   enoxaparin (LOVENOX) injection  40 mg Subcutaneous Q24H   insulin aspart  0-5 Units Subcutaneous QHS   insulin aspart  0-9 Units Subcutaneous  TID WC   lisinopril  5 mg Oral Daily   methylPREDNISolone (SOLU-MEDROL) injection  60 mg Intravenous Q12H   methylPREDNISolone (SOLU-MEDROL) injection  20 mg Intravenous Once   Continuous Infusions:  sodium chloride     remdesivir 100 mg in NS 100 mL 100 mg (05/24/20 0944)   PRN Meds:.acetaminophen, guaiFENesin-dextromethorphan, [DISCONTINUED] ondansetron **OR** ondansetron (ZOFRAN) IV  Antibiotics  :    Anti-infectives (From admission, onward)   Start     Dose/Rate Route Frequency Ordered Stop   05/24/20 1000  remdesivir 100 mg in sodium chloride 0.9 % 100 mL IVPB       "Followed by" Linked Group Details   100 mg 200 mL/hr over 30 Minutes Intravenous Daily 05/23/20 1820 05/28/20 0959   05/23/20 1830  remdesivir 200 mg in sodium chloride 0.9% 250 mL IVPB       "Followed by" Linked Group Details   200 mg 580 mL/hr over 30 Minutes Intravenous Once 05/23/20 1820 05/23/20 2218       Time Spent in minutes  30  Susa RaringPrashant Tyrice Hewitt M.D on 05/24/2020 at 10:15 AM  To page go to [email or web address] - password [email or web address]  [facility] -  Office  [phone]    See all Orders from today for further details    Objective:   Vitals:   05/24/20 0615 05/24/20 0700 05/24/20 0800 05/24/20 0900  BP: 134/89 120/73 (!) 138/101 131/90  Pulse: 77 68 75 80  Resp: (!) 22 (!) 25 (!) 25 (!) 21  Temp:      TempSrc:      SpO2: 92% 92% (!) 85% 91%  Weight:      Height:        Wt Readings from Last 3 Encounters:  05/23/20 68 kg     Intake/Output Summary (Last 24 hours) at 05/24/2020 1015 Last data filed at 05/23/2020 2218 Gross per 24 hour  Intake 250 ml  Output --  Net 250 ml     Physical Exam  Awake Alert, No new F.N deficits, Normal affect [place].AT,PERRAL Supple Neck,No JVD, No cervical lymphadenopathy appriciated.  Symmetrical Chest wall movement, Good air movement bilaterally, CTAB RRR,No Gallops,Rubs or new Murmurs, No Parasternal Heave +ve B.Sounds, Abd Soft, No tenderness, No  organomegaly appriciated, No rebound - guarding or rigidity. No Cyanosis, Clubbing or edema, No new Rash or bruise      Data Review:    CBC Recent Labs  Lab 05/23/20 1611 05/24/20 0422  WBC 7.6 6.4  HGB 13.1 13.7  HCT 40.5 42.6  PLT 164 177  MCV 90.6 91.2  MCH 29.3 29.3  MCHC 32.3 32.2  RDW 13.2 13.1  LYMPHSABS 1.3 1.3  MONOABS 0.2 0.1  EOSABS 0.0 0.0  BASOSABS 0.0 0.0    Recent Labs  Lab 05/23/20 1610 05/23/20 1611 05/24/20 0422 05/24/20 0704  NA  --  130* 137  --   K  --  4.1 4.6  --   CL  --  97* 103  --   CO2  --  22 25  --   GLUCOSE  --  107* 137*  --   BUN  --  10 12  --   CREATININE  --  0.47 0.63  --   CALCIUM  --  8.1* 8.4*  --   AST  --  40 42*  --   ALT  --  22 25  --   ALKPHOS  --  65 62  --   BILITOT  --  0.6 0.5  --   ALBUMIN  --  2.7* 2.6*  --   MG  --   --  2.0  --   CRP  --  12.0* 10.3*  --   DDIMER  --  1.02* 0.99*  --   PROCALCITON  --  0.17  --  0.11  LATICACIDVEN 0.9  --   --   --   BNP  --   --   --  108.8*    ------------------------------------------------------------------------------------------------------------------ Recent Labs    05/23/20 1611  TRIG 54    No results found for: HGBA1C ------------------------------------------------------------------------------------------------------------------ No results for input(s): TSH, T4TOTAL, T3FREE, THYROIDAB in the last 72 hours.  Invalid input(s): FREET3  Cardiac Enzymes No results for input(s): CKMB, TROPONINI, MYOGLOBIN in the last 168 hours.  Invalid input(s): CK ------------------------------------------------------------------------------------------------------------------    Component Value Date/Time   BNP 108.8 (H) 05/24/2020 [id number]    Micro Results Recent Results (from the past 240 hour(s))  SARS Coronavirus 2 by RT PCR (hospital order[facility]) Nasopharyngeal Nasopharyngeal  Swab     Status: Abnormal   Collection Time: 05/23/20   4:10 PM   Specimen: Nasopharyngeal Swab  Result Value Ref Range Status   SARS Coronavirus 2 POSITIVE (A) NEGATIVE Final    Comment: RESULT CALLED TO, READ BACK BY AND VERIFIED WITH: V GLOSSSON RN 05/23/20 AT 1755 SK (NOTE) SARS-CoV-2 target nucleic acids are DETECTED  SARS-CoV-2 RNA is generally detectable in upper respiratory specimens  during the acute phase of infection.  Positive results are indicative  of the presence of the identified virus, but do not rule out bacterial infection or co-infection with other pathogens not detected by the test.  Clinical correlation with patient history and  other diagnostic information is necessary to determine patient infection status.  The expected result is negative.  Fact Sheet for Patients:   BoilerBrush.com.cy   Fact Sheet for Healthcare Providers:   https://pope.com/    This test is not yet approved or cleared by the [place] FDA and  has been authorized for detection and/or diagnosis of SARS-CoV-2 by FDA under an Emergency Use Authorization (EUA).  This EUA will remain in effect (meaning this tes t can be used) for the duration of  the COVID-19 declaration under Section 564(b)(1) of the Act, 21 U.S.C. section 360-bbb-3(b)(1), unless the authorization is terminated or revoked sooner.  Performed at [facility], [address] [place], [place] [address]     Radiology Reports DG Chest [place] 1 View  Result Date: 05/23/2020 CLINICAL DATA:  Shortness of breath.  COVID positive. EXAM: PORTABLE CHEST 1 VIEW COMPARISON:  None. FINDINGS: There are diffuse bilateral ground-glass airspace opacities. No pneumothorax. No significant pleural effusion. There is cardiac enlargement with probable atherosclerotic changes of the thoracic aorta. There is an old healed fracture of the mid right clavicle. There is a calcification in the left upper quadrant favored to represent a calcified and  tortuous splenic artery. IMPRESSION: Diffuse bilateral ground-glass airspace opacities consistent with the patient's history of viral pneumonia. Electronically Signed   By: Katherine Mantle M.D.   On: 05/23/2020 15:29

## 2020-05-24 NOTE — Progress Notes (Signed)
Pt arrived to 2 west room 36. [facility] Pt has IV x 2, wearing 4L O2 via [place]. Pt is A&O x 4. Pt oriented to room and unit. Call bell given to pt.

## 2020-05-24 NOTE — ED Notes (Signed)
Pt's O2 sats noted to be maintaining in mid-80's, RR mid 20's. O2 increased to 4L, RR decreased to <20, O2 sats 90-93%

## 2020-05-24 NOTE — ED Notes (Addendum)
HOB lowered for comfort, lights dimmed & blanket provided. No additional requests/needs expressed at this time

## 2020-05-25 ENCOUNTER — Encounter (HOSPITAL_COMMUNITY): Payer: Self-pay | Admitting: Internal Medicine

## 2020-05-25 LAB — COMPREHENSIVE METABOLIC PANEL
ALT: 26 U/L (ref 0–44)
AST: 42 U/L — ABNORMAL HIGH (ref 15–41)
Albumin: 2.4 g/dL — ABNORMAL LOW (ref 3.5–5.0)
Alkaline Phosphatase: 53 U/L (ref 38–126)
Anion gap: 10 (ref 5–15)
BUN: 17 mg/dL (ref 8–23)
CO2: 24 mmol/L (ref 22–32)
Calcium: 8.3 mg/dL — ABNORMAL LOW (ref 8.9–10.3)
Chloride: 104 mmol/L (ref 98–111)
Creatinine, Ser: 0.64 mg/dL (ref 0.44–1.00)
GFR calc Af Amer: >60 mL/min (ref >60)
GFR calc non Af Amer: >60 mL/min (ref >60)
Glucose, Bld: 141 mg/dL — ABNORMAL HIGH (ref 70–99)
Potassium: 4 mmol/L (ref 3.5–5.1)
Sodium: 138 mmol/L (ref 135–145)
Total Bilirubin: 0.1 mg/dL — ABNORMAL LOW (ref 0.3–1.2)
Total Protein: 6.1 g/dL — ABNORMAL LOW (ref 6.5–8.1)

## 2020-05-25 LAB — CBC WITH DIFFERENTIAL/PLATELET
Abs Immature Granulocytes: 0.07 10*3/uL (ref 0.00–0.07)
Basophils Absolute: 0 10*3/uL (ref 0.0–0.1)
Basophils Relative: 0 %
Eosinophils Absolute: 0 10*3/uL (ref 0.0–0.5)
Eosinophils Relative: 0 %
HCT: 39.6 % (ref 36.0–46.0)
Hemoglobin: 13 g/dL (ref 12.0–15.0)
Immature Granulocytes: 1 %
Lymphocytes Relative: 22 %
Lymphs Abs: 1.9 10*3/uL (ref 0.7–4.0)
MCH: 29.3 pg (ref 26.0–34.0)
MCHC: 32.8 g/dL (ref 30.0–36.0)
MCV: 89.4 fL (ref 80.0–100.0)
Monocytes Absolute: 0.4 10*3/uL (ref 0.1–1.0)
Monocytes Relative: 5 %
Neutro Abs: 6 10*3/uL (ref 1.7–7.7)
Neutrophils Relative %: 72 %
Platelets: 227 10*3/uL (ref 150–400)
RBC: 4.43 MIL/uL (ref 3.87–5.11)
RDW: 13 % (ref 11.5–15.5)
WBC: 8.4 10*3/uL (ref 4.0–10.5)
nRBC: 0 % (ref 0.0–0.2)

## 2020-05-25 LAB — GLUCOSE, CAPILLARY
Glucose-Capillary: 143 mg/dL — ABNORMAL HIGH (ref 70–99)
Glucose-Capillary: 124 mg/dL — ABNORMAL HIGH (ref 70–99)
Glucose-Capillary: 142 mg/dL — ABNORMAL HIGH (ref 70–99)
Glucose-Capillary: 158 mg/dL — ABNORMAL HIGH (ref 70–99)

## 2020-05-25 LAB — HEMOGLOBIN A1C
Hgb A1c MFr Bld: 5.8 % — ABNORMAL HIGH (ref 4.8–5.6)
Mean Plasma Glucose: 119.76 mg/dL

## 2020-05-25 LAB — D-DIMER, QUANTITATIVE: D-Dimer, Quant: 0.84 ug/mL-FEU — ABNORMAL HIGH (ref 0.00–0.50)

## 2020-05-25 LAB — MAGNESIUM: Magnesium: 2.1 mg/dL (ref 1.7–2.4)

## 2020-05-25 LAB — LACTIC ACID, PLASMA: Lactic Acid, Venous: 1.2 mmol/L (ref 0.5–1.9)

## 2020-05-25 LAB — C-REACTIVE PROTEIN: CRP: 2.1 mg/dL — ABNORMAL HIGH (ref <1.0)

## 2020-05-25 LAB — PROCALCITONIN: Procalcitonin: <0.1 ng/mL

## 2020-05-25 LAB — BRAIN NATRIURETIC PEPTIDE: B Natriuretic Peptide: 218.1 pg/mL — ABNORMAL HIGH (ref 0.0–100.0)

## 2020-05-25 NOTE — Evaluation (Signed)
Physical Therapy Evaluation Patient Details Name: Margaret Mclaughlin MRN: [id number] DOB: [birth year] Today's Date: 05/25/2020   History of Present Illness  69 yo female with PMH of HTN presents to ED on 9/20 with covid pneumonitis, pt with moderate to severe parenchymal lung injury. Pt is unvaccinated.  Clinical Impression   Pt presents with mild to moderated dyspnea on exertion, increased time and effort to mobilize, decreased knowledge and application of breathing techniques, and decreased activity tolerance vs baseline. Pt to benefit from acute PT to address deficits. Pt ambulated hallway distance with no AD and close guard for safety, SpO2 ranging from 85-90% on 4LO2 during mobility. Once instructed in breathing technique, pt with excellent breathing and SpO2 recovery >88%. PT anticipates no follow up needs. PT to progress mobility as tolerated, and will continue to follow acutely.      Follow Up Recommendations No PT follow up;Supervision for mobility/OOB    Equipment Recommendations  None recommended by PT    Recommendations for Other Services       Precautions / Restrictions Precautions Precautions: None Precaution Comments: sats Restrictions Weight Bearing Restrictions: No      Mobility  Bed Mobility Overal bed mobility: Needs Assistance Bed Mobility: Sit to Supine       Sit to supine: Supervision;HOB elevated   General bed mobility comments: Up in chair upon PT arrival to room. increased time to return to supine, watching for lines/leads.  Transfers Overall transfer level: Needs assistance   Transfers: Sit to/from Stand Sit to Stand: Supervision         General transfer comment: for safety, no unsteadiness upon standing.  Ambulation/Gait Ambulation/Gait assistance: Min guard Gait Distance (Feet): 200 Feet Assistive device: None Gait Pattern/deviations: Step-through pattern Gait velocity: decr   General Gait Details: min guard for safety, slowed and  cautious gait with mild unsteadiness with direcitonal changes. Standing rest breaks x2 to recover SpO2 (86-90% during mobility) and DOE 2/4. Pt with excellent breathing technique (in through nose, out through mouth, slow deep breaths)  Stairs            Wheelchair Mobility    Modified Rankin (Stroke Patients Only)       Balance Overall balance assessment: Mild deficits observed, not formally tested                                           Pertinent Vitals/Pain Pain Assessment: No/denies pain    Home Living Family/patient expects to be discharged to:: Private residence Living Arrangements: Alone Available Help at Discharge: Family;Available PRN/intermittently Type of Home: House Home Access: Stairs to enter   Entrance Stairs-Number of Steps: 1 Home Layout: One level Home Equipment: Grab bars - tub/shower;Walker - 2 wheels;Cane - single point      Prior Function Level of Independence: Independent         Comments: Pt formally married, husband has since passed. Pt has 3 pigs, enjoys walking on her property.     Hand Dominance   Dominant Hand: Right    Extremity/Trunk Assessment   Upper Extremity Assessment Upper Extremity Assessment: Defer to OT evaluation    Lower Extremity Assessment Lower Extremity Assessment: Overall WFL for tasks assessed    Cervical / Trunk Assessment Cervical / Trunk Assessment: Normal  Communication   Communication: No difficulties  Cognition Arousal/Alertness: Awake/alert Behavior During Therapy: WFL for tasks assessed/performed Overall Cognitive Status:  Within Functional Limits for tasks assessed                                        General Comments General comments (skin integrity, edema, etc.): SpO2 85-90% on 4LO2 during mobility    Exercises     Assessment/Plan    PT Assessment Patient needs continued PT services  PT Problem List Decreased mobility;Decreased activity  tolerance;Decreased balance;Cardiopulmonary status limiting activity       PT Treatment Interventions Therapeutic activities;Gait training;Therapeutic exercise;Patient/family education;Functional mobility training    PT Goals (Current goals can be found in the Care Plan section)  Acute Rehab PT Goals Patient Stated Goal: go home PT Goal Formulation: With patient Time For Goal Achievement: 06/08/20 Potential to Achieve Goals: Good    Frequency Min 3X/week   Barriers to discharge        Co-evaluation               AM-PAC PT "6 Clicks" Mobility  Outcome Measure Help needed turning from your back to your side while in a flat bed without using bedrails?: None Help needed moving from lying on your back to sitting on the side of a flat bed without using bedrails?: None Help needed moving to and from a bed to a chair (including a wheelchair)?: A Little Help needed standing up from a chair using your arms (e.g., wheelchair or bedside chair)?: None Help needed to walk in hospital room?: A Little Help needed climbing 3-5 steps with a railing? : A Little 6 Click Score: 21    End of Session Equipment Utilized During Treatment: Oxygen Activity Tolerance: Patient limited by fatigue Patient left: in bed;with call bell/phone within reach Nurse Communication: Mobility status PT Visit Diagnosis: Other abnormalities of gait and mobility (R26.89)    Time: 1333-1350 PT Time Calculation (min) (ACUTE ONLY): 17 min   Charges:   PT Evaluation $PT Eval Low Complexity: 1 Low          Ryley Teater E, PT Acute Rehabilitation Services Pager [phone]  Office [phone]    [name] [name] 05/25/2020, 4:47 PM

## 2020-05-25 NOTE — Plan of Care (Signed)

## 2020-05-25 NOTE — Progress Notes (Signed)
PROGRESS NOTE    Margaret Mclaughlin  WEX:[id number] DOB: 08-27-1951 DOA: 05/23/2020 PCP: Pcp, No   Chief Complaint  Patient presents with  . Shortness of Breath    Covid pos   Brief Narrative:  Margaret Sultan Smithis Meade Hogeland 69 y.o.femalewith medical history significant of HTN, who is unfortunately non Vaccinated and developed symptoms of illness about 1 weeks ago, got outpt Infusion but did not show any signs of improvement, continued to be more short of breath and came to the ER where she was diagnosed with acute hypoxic respiratory failure due to COVID-19 pneumonia and she was admitted.  Assessment & Plan:   Active Problems:   COVID-19 virus infection   Pneumonia due to COVID-19 virus   Essential hypertension   Hyperparathyroidism (HCC)   Hyponatremia  Acute Hypoxic Respiratory Failure 2/2 COVID 19 Pneumonia:  Unvaccinated CXR 9/20 with diffuse bilateral GGO  Currently on 4 L [place] Continue steroids, baricitinib, remdesivir Strict I/O, daily weights OOB, prone as able, IS, flutter  COVID-19 Labs  Recent Labs    05/23/20 1611 05/24/20 0422 05/25/20 0553  DDIMER 1.02* 0.99* 0.84*  FERRITIN 739* 843*  --   LDH 262*  --   --   CRP 12.0* 10.3* 2.1*    Lab Results  Component Value Date   SARSCOV2NAA POSITIVE (Yvette Roark) 05/23/2020   Elevated BNP Follow echo  Hypertension Continue lisinopril  Hyperparathyroidism Continue to monitor, follow outpatient  DVT prophylaxis: lovenox Code Status: DNR Family Communication: none at bedside - daughter Disposition:   Status is: Inpatient  Remains inpatient appropriate because:Inpatient level of care appropriate due to severity of illness   Dispo: The patient is from: Home              Anticipated d/c is to: pending              Anticipated d/c date is: > 3 days              Patient currently is not medically stable to d/c.   Consultants:   none  Procedures:   none  Antimicrobials:  Anti-infectives (From admission, onward)    Start     Dose/Rate Route Frequency Ordered Stop   05/24/20 1000  remdesivir 100 mg in sodium chloride 0.9 % 100 mL IVPB       "Followed by" Linked Group Details   100 mg 200 mL/hr over 30 Minutes Intravenous Daily 05/23/20 1820 05/28/20 0959   05/23/20 1830  remdesivir 200 mg in sodium chloride 0.9% 250 mL IVPB       "Followed by" Linked Group Details   200 mg 580 mL/hr over 30 Minutes Intravenous Once 05/23/20 1820 05/23/20 2218     Subjective: Doing ok No new complaints today  Objective: Vitals:   05/24/20 2138 05/25/20 0821 05/25/20 1555 05/25/20 1557  BP:  132/76  124/81  Pulse:  68 67 70  Resp:  18    Temp:  98.3 F (36.8 C)  98.9 F (37.2 C)  TempSrc:      SpO2: 93% (!) 89% 92% 90%  Weight:      Height:        Intake/Output Summary (Last 24 hours) at 05/25/2020 1819 Last data filed at 05/25/2020 0934 Gross per 24 hour  Intake 898.22 ml  Output 0 ml  Net 898.22 ml   Filed Weights   05/23/20 1552  Weight: 68 kg    Examination:  General exam: Appears calm and comfortable  Respiratory system: Clear  to auscultation. Respiratory effort normal. Cardiovascular system: S1 & S2 heard, RRR Gastrointestinal system: Abdomen is nondistended, soft and nontender. Central nervous system: Alert and oriented. No focal neurological deficits. Extremities: no lee Skin: No rashes, lesions or ulcers Psychiatry: Judgement and insight appear normal. Mood & affect appropriate.     Data Reviewed: I have personally reviewed following labs and imaging studies  CBC: Recent Labs  Lab 05/23/20 1611 05/24/20 0422 05/25/20 0553  WBC 7.6 6.4 8.4  NEUTROABS 6.0 4.9 6.0  HGB 13.1 13.7 13.0  HCT 40.5 42.6 39.6  MCV 90.6 91.2 89.4  PLT 164 177 227    Basic Metabolic Panel: Recent Labs  Lab 05/23/20 1611 05/24/20 0422 05/25/20 0553  NA 130* 137 138  K 4.1 4.6 4.0  CL 97* 103 104  CO2 22 25 24   GLUCOSE 107* 137* 141*  BUN 10 12 17   CREATININE 0.47 0.63 0.64  CALCIUM  8.1* 8.4* 8.3*  MG  --  2.0 2.1    GFR: Estimated Creatinine Clearance: 56.9 mL/min (by C-G formula based on SCr of 0.64 mg/dL).  Liver Function Tests: Recent Labs  Lab 05/23/20 1611 05/24/20 0422 05/25/20 0553  AST 40 42* 42*  ALT 22 25 26   ALKPHOS 65 62 53  BILITOT 0.6 0.5 0.1*  PROT 6.6 6.7 6.1*  ALBUMIN 2.7* 2.6* 2.4*    CBG: Recent Labs  Lab 05/24/20 1700 05/24/20 2150 05/25/20 0819 05/25/20 1139 05/25/20 1557  GLUCAP 127* 131* 143* 124* 142*     Recent Results (from the past 240 hour(s))  SARS Coronavirus 2 by RT PCR (hospital order, performed in [facility] hospital lab) Nasopharyngeal Nasopharyngeal Swab     Status: Abnormal   Collection Time: 05/23/20  4:10 PM   Specimen: Nasopharyngeal Swab  Result Value Ref Range Status   SARS Coronavirus 2 POSITIVE (Tryone Kille) NEGATIVE Final    Comment: RESULT CALLED TO, READ BACK BY AND VERIFIED WITH: V GLOSSSON RN 05/23/20 AT 1755 SK (NOTE) SARS-CoV-2 target nucleic acids are DETECTED  SARS-CoV-2 RNA is generally detectable in upper respiratory specimens  during the acute phase of infection.  Positive results are indicative  of the presence of the identified virus, but do not rule out bacterial infection or co-infection with other pathogens not detected by the test.  Clinical correlation with patient history and  other diagnostic information is necessary to determine patient infection status.  The expected result is negative.  Fact Sheet for Patients:   [facility]   Fact Sheet for Healthcare Providers:   05/25/20    This test is not yet approved or cleared by the 05/25/20 FDA and  has been authorized for detection and/or diagnosis of SARS-CoV-2 by FDA under an Emergency Use Authorization (EUA).  This EUA will remain in effect (meaning this tes t can be used) for the duration of  the COVID-19 declaration under Section 564(b)(1) of the Act,  21 U.S.C. section 360-bbb-3(b)(1), unless the authorization is terminated or revoked sooner.  Performed at [facility], [address], [place], [facility] [address]   Blood Culture (routine x 2)     Status: None (Preliminary result)   Collection Time: 05/23/20  4:11 PM   Specimen: BLOOD  Result Value Ref Range Status   Specimen Description BLOOD BLOOD RIGHT FOREARM  Final   Special Requests   Final    BOTTLES DRAWN AEROBIC AND ANAEROBIC Blood Culture adequate volume   Culture   Final    NO GROWTH 2  DAYS Performed at [facility], [address] [place], [place] [address]    Report Status PENDING  Incomplete  Blood Culture (routine x 2)     Status: None (Preliminary result)   Collection Time: 05/23/20  4:11 PM   Specimen: BLOOD  Result Value Ref Range Status   Specimen Description BLOOD BLOOD LEFT FOREARM  Final   Special Requests   Final    BOTTLES DRAWN AEROBIC AND ANAEROBIC Blood Culture adequate volume   Culture   Final    NO GROWTH 2 DAYS Performed at [facility], [address], [place], [place] [address]    Report Status PENDING  Incomplete         Radiology Studies: No results found.      Scheduled Meds: . baricitinib  4 mg Oral Daily  . enoxaparin (LOVENOX) injection  40 mg Subcutaneous Q24H  . insulin aspart  0-5 Units Subcutaneous QHS  . insulin aspart  0-9 Units Subcutaneous TID WC  . lisinopril  5 mg Oral Daily  . methylPREDNISolone (SOLU-MEDROL) injection  60 mg Intravenous Q12H   Continuous Infusions: . remdesivir 100 mg in NS 100 mL 100 mg (05/25/20 0934)     LOS: 2 days    Time spent: over 30 min    Lacretia Nicks, MD [facility]   To contact the attending provider between 7A-7P or the covering provider during after hours 7P-7A, please log into the web site [email or web address] and access using universal [place] password for that web site. If you do not have the password, please call the hospital  operator.  05/25/2020, 6:19 PM

## 2020-05-26 ENCOUNTER — Inpatient Hospital Stay (HOSPITAL_COMMUNITY): Payer: Medicare Other

## 2020-05-26 DIAGNOSIS — R0602 Shortness of breath: Secondary | ICD-10-CM

## 2020-05-26 LAB — CBC WITH DIFFERENTIAL/PLATELET
Abs Immature Granulocytes: 0 10*3/uL (ref 0.00–0.07)
Basophils Absolute: 0 10*3/uL (ref 0.0–0.1)
Basophils Relative: 0 %
Eosinophils Absolute: 0 10*3/uL (ref 0.0–0.5)
Eosinophils Relative: 0 %
HCT: 39 % (ref 36.0–46.0)
Hemoglobin: 12.8 g/dL (ref 12.0–15.0)
Lymphocytes Relative: 6 %
Lymphs Abs: 0.5 10*3/uL — ABNORMAL LOW (ref 0.7–4.0)
MCH: 29.6 pg (ref 26.0–34.0)
MCHC: 32.8 g/dL (ref 30.0–36.0)
MCV: 90.3 fL (ref 80.0–100.0)
Monocytes Absolute: 0.2 10*3/uL (ref 0.1–1.0)
Monocytes Relative: 3 %
Neutro Abs: 7.2 10*3/uL (ref 1.7–7.7)
Neutrophils Relative %: 91 %
Platelets: 274 10*3/uL (ref 150–400)
RBC: 4.32 MIL/uL (ref 3.87–5.11)
RDW: 12.9 % (ref 11.5–15.5)
WBC: 7.9 10*3/uL (ref 4.0–10.5)
nRBC: 0 % (ref 0.0–0.2)
nRBC: 0 /100 WBC

## 2020-05-26 LAB — COMPREHENSIVE METABOLIC PANEL
ALT: 30 U/L (ref 0–44)
AST: 46 U/L — ABNORMAL HIGH (ref 15–41)
Albumin: 2.4 g/dL — ABNORMAL LOW (ref 3.5–5.0)
Alkaline Phosphatase: 52 U/L (ref 38–126)
Anion gap: 7 (ref 5–15)
BUN: 20 mg/dL (ref 8–23)
CO2: 26 mmol/L (ref 22–32)
Calcium: 8.2 mg/dL — ABNORMAL LOW (ref 8.9–10.3)
Chloride: 103 mmol/L (ref 98–111)
Creatinine, Ser: 0.57 mg/dL (ref 0.44–1.00)
GFR calc Af Amer: >60 mL/min (ref >60)
GFR calc non Af Amer: >60 mL/min (ref >60)
Glucose, Bld: 161 mg/dL — ABNORMAL HIGH (ref 70–99)
Potassium: 4.5 mmol/L (ref 3.5–5.1)
Sodium: 136 mmol/L (ref 135–145)
Total Bilirubin: 0.4 mg/dL (ref 0.3–1.2)
Total Protein: 5.7 g/dL — ABNORMAL LOW (ref 6.5–8.1)

## 2020-05-26 LAB — GLUCOSE, CAPILLARY
Glucose-Capillary: 141 mg/dL — ABNORMAL HIGH (ref 70–99)
Glucose-Capillary: 141 mg/dL — ABNORMAL HIGH (ref 70–99)
Glucose-Capillary: 182 mg/dL — ABNORMAL HIGH (ref 70–99)
Glucose-Capillary: 114 mg/dL — ABNORMAL HIGH (ref 70–99)

## 2020-05-26 LAB — ECHOCARDIOGRAM LIMITED
Area-P 1/2: 4.39 cm2
Calc EF: 63.5 %
Height: 61 in
MV M vel: 4.56 m/s
MV Peak grad: 83.2 mmHg
S' Lateral: 3.6 cm
Single Plane A2C EF: 62.7 %
Single Plane A4C EF: 63.7 %
Weight: 2400 oz

## 2020-05-26 LAB — MAGNESIUM: Magnesium: 2.1 mg/dL (ref 1.7–2.4)

## 2020-05-26 LAB — C-REACTIVE PROTEIN: CRP: 1 mg/dL — ABNORMAL HIGH (ref <1.0)

## 2020-05-26 LAB — PROCALCITONIN: Procalcitonin: <0.1 ng/mL

## 2020-05-26 LAB — D-DIMER, QUANTITATIVE: D-Dimer, Quant: 1.04 ug/mL-FEU — ABNORMAL HIGH (ref 0.00–0.50)

## 2020-05-26 LAB — BRAIN NATRIURETIC PEPTIDE: B Natriuretic Peptide: 208.2 pg/mL — ABNORMAL HIGH (ref 0.0–100.0)

## 2020-05-26 MED ORDER — FUROSEMIDE 10 MG/ML IJ SOLN
40.0000 mg | Freq: Once | INTRAMUSCULAR | Status: AC
  Administered 2020-05-26: 40 mg via INTRAVENOUS
  Filled 2020-05-26: qty 4

## 2020-05-26 MED ORDER — TRAZODONE HCL 50 MG PO TABS
50.0000 mg | ORAL_TABLET | Freq: Every day | ORAL | Status: DC
  Administered 2020-05-26 – 2020-05-27 (×2): 50 mg via ORAL
  Filled 2020-05-26 (×2): qty 1

## 2020-05-26 NOTE — Evaluation (Signed)
Occupational Therapy Evaluation Patient Details Name: Margaret Mclaughlin MRN: [id number] DOB: November 10, 1950 Today's Date: 05/26/2020    History of Present Illness 69 yo female with PMH of HTN presents to ED on 9/20 with covid pneumonitis, pt with moderate to severe parenchymal lung injury. Pt is unvaccinated.   Clinical Impression   Pt admitted with the above diagnoses and presents with below problem list. Pt will benefit from continued acute OT to address the below listed deficits and maximize independence with basic ADLs prior to d/c home. PTA pt was independent with ADLs, lives alone. Pt is currently setup to supervision with ADLs and functional transfers/mobility. Pt on 4L O2 throughout session with sats 85-88, lowest observed drop to 82. Walked 2 laps in the room, 2 standing rest breaks incorporated 2/2 fatigue and to encourage breathing exercise prior to continuing. Began to discuss some energy conservation strategies to incorporate during  ADLs.      Follow Up Recommendations  Supervision - Intermittent    Equipment Recommendations  None recommended by OT    Recommendations for Other Services       Precautions / Restrictions Precautions Precautions: None Precaution Comments: sats Restrictions Weight Bearing Restrictions: No      Mobility Bed Mobility               General bed mobility comments: up in recliner  Transfers Overall transfer level: Needs assistance   Transfers: Sit to/from Stand Sit to Stand: Supervision         General transfer comment: for safety, no unsteadiness upon standing.    Balance Overall balance assessment: Mild deficits observed, not formally tested                                         ADL either performed or assessed with clinical judgement   ADL Overall ADL's : Needs assistance/impaired Eating/Feeding: Set up;Sitting   Grooming: Wash/dry hands;Supervision/safety;Standing   Upper Body Bathing: Set up;Sitting    Lower Body Bathing: Supervison/ safety;Sit to/from stand   Upper Body Dressing : Set up;Sitting   Lower Body Dressing: Supervision/safety;Sit to/from stand   Toilet Transfer: Supervision/safety   Toileting- Architect and Hygiene: Supervision/safety   Tub/ Engineer, structural: Supervision/safety   Functional mobility during ADLs: Supervision/safety General ADL Comments: Pt completed household distance functional mobility. 2x standing rest breaks (once to rest 2/2 fatigue, second time cued for break and breathing exercise 2/2 O2 sat 82.     Vision         Perception     Praxis      Pertinent Vitals/Pain Pain Assessment: No/denies pain     Hand Dominance Right   Extremity/Trunk Assessment Upper Extremity Assessment Upper Extremity Assessment: Overall WFL for tasks assessed   Lower Extremity Assessment Lower Extremity Assessment: Defer to PT evaluation   Cervical / Trunk Assessment Cervical / Trunk Assessment: Normal   Communication Communication Communication: No difficulties   Cognition Arousal/Alertness: Awake/alert Behavior During Therapy: WFL for tasks assessed/performed Overall Cognitive Status: Within Functional Limits for tasks assessed                                     General Comments  SpO2 85-88 on 4L 1x dropped to 82 after walking a lap in her room. Recovered back to 87-88 at end of session.  Exercises     Shoulder Instructions      Home Living Family/patient expects to be discharged to:: Private residence Living Arrangements: Alone Available Help at Discharge: Family;Available PRN/intermittently Type of Home: House Home Access: Stairs to enter [employer] of Steps: 1   Home Layout: One level     Bathroom Shower/Tub: Chief Strategy Officer: Standard     Home Equipment: Grab bars - tub/shower;Walker - 2 wheels;Cane - single point;Shower seat          Prior Functioning/Environment  Level of Independence: Independent        Comments: Pt formally married, husband has since passed. Pt has 3 pigs, enjoys walking on her property.        OT Problem List: Decreased activity tolerance;Impaired balance (sitting and/or standing);Decreased knowledge of precautions;Decreased knowledge of use of DME or AE;Cardiopulmonary status limiting activity      OT Treatment/Interventions: Self-care/ADL training;Therapeutic exercise;Energy conservation;DME and/or AE instruction;Therapeutic activities;Patient/family education;Balance training    OT Goals(Current goals can be found in the care plan section) Acute Rehab OT Goals Patient Stated Goal: go home OT Goal Formulation: With patient Time For Goal Achievement: 06/09/20 Potential to Achieve Goals: Good ADL Goals Pt Will Perform Grooming: with modified independence;standing Pt Will Perform Upper Body Bathing: Independently;sitting Pt Will Perform Lower Body Bathing: with modified independence;sit to/from stand Pt Will Perform Tub/Shower Transfer: Tub transfer;shower seat;ambulating;Independently  OT Frequency: Min 2X/week   Barriers to D/C:            Co-evaluation              AM-PAC OT "6 Clicks" Daily Activity     Outcome Measure Help from another person eating meals?: None Help from another person taking care of personal grooming?: None Help from another person toileting, which includes using toliet, bedpan, or urinal?: A Little Help from another person bathing (including washing, rinsing, drying)?: A Little Help from another person to put on and taking off regular upper body clothing?: None Help from another person to put on and taking off regular lower body clothing?: None 6 Click Score: 22   End of Session Equipment Utilized During Treatment: Oxygen (4L )  Activity Tolerance: Patient limited by fatigue;Patient tolerated treatment well Patient left: in chair;with call bell/phone within reach  OT Visit  Diagnosis: Unsteadiness on feet (R26.81);Muscle weakness (generalized) (M62.81)                Time: 1207-1229 OT Time Calculation (min): 22 min Charges:  OT General Charges $OT Visit: 1 Visit OT Evaluation $OT Eval Low Complexity: 1 Low  Raynald Kemp, OT Acute Rehabilitation Services Pager: [phone] Office: [phone]   Pilar Grammes 05/26/2020, 12:49 PM

## 2020-05-26 NOTE — Progress Notes (Addendum)
PROGRESS NOTE    Margaret Mclaughlin  ZOX:[id number] DOB: 12/06/1950 DOA: 05/23/2020 PCP: Pcp, No   Chief Complaint  Patient presents with  . Shortness of Breath    Covid pos   Brief Narrative:  Margaret Mclaughlin Margaret Mclaughlin 69 y.o.femalewith medical history significant of HTN, who is unfortunately non Vaccinated and developed symptoms of illness about 1 weeks ago, got outpt Infusion but did not show any signs of improvement, continued to be more short of breath and came to the ER where she was diagnosed with acute hypoxic respiratory failure due to COVID-19 pneumonia and she was admitted.  Assessment & Plan:   Active Problems:   COVID-19 virus infection   Pneumonia due to COVID-19 virus   Essential hypertension   Hyperparathyroidism (HCC)   Hyponatremia  Acute Hypoxic Respiratory Failure 2/2 COVID 19 Pneumonia:  Unvaccinated CXR 9/20 with diffuse bilateral GGO  Currently on 4 L [place], wean as tolerated Continue steroids, baricitinib, remdesivir Strict I/O, daily weights OOB, prone as able, IS, flutter, therapy  COVID-19 Labs  Recent Labs    05/23/20 1611 05/23/20 1611 05/24/20 0422 05/25/20 0553 05/26/20 0214  DDIMER 1.02*   < > 0.99* 0.84* 1.04*  FERRITIN 739*  --  843*  --   --   LDH 262*  --   --   --   --   CRP 12.0*   < > 10.3* 2.1* 1.0*   < > = values in this interval not displayed.    Lab Results  Component Value Date   SARSCOV2NAA POSITIVE (Chriss Mannan) 05/23/2020   Elevated BNP Follow echo -> normal EF, normal LV diastolic parameters (see report)  Hypertension Continue lisinopril  Hyperparathyroidism Continue to monitor, follow outpatient  Insomnia Try trazodone  DVT prophylaxis: lovenox Code Status: DNR Family Communication: none at bedside - daughter 9/23 Disposition:   Status is: Inpatient  Remains inpatient appropriate because:Inpatient level of care appropriate due to severity of illness   Dispo: The patient is from: Home              Anticipated d/c is  to: pending              Anticipated d/c date is: > 3 days              Patient currently is not medically stable to d/c.   Consultants:   none  Procedures:  Echo IMPRESSIONS    1. Left ventricular ejection fraction, by estimation, is 60 to 65%. The  left ventricle has normal function. The left ventricle has no regional  wall motion abnormalities. Left ventricular diastolic parameters were  normal.  2. Right ventricular systolic function is normal. The right ventricular  size is normal. There is normal pulmonary artery systolic pressure.  3. The mitral valve is normal in structure. Trivial mitral valve  regurgitation. No evidence of mitral stenosis.  4. The aortic valve is normal in structure. Aortic valve regurgitation is  not visualized. No aortic stenosis is present.  Antimicrobials:  Anti-infectives (From admission, onward)   Start     Dose/Rate Route Frequency Ordered Stop   05/24/20 1000  remdesivir 100 mg in sodium chloride 0.9 % 100 mL IVPB       "Followed by" Linked Group Details   100 mg 200 mL/hr over 30 Minutes Intravenous Daily 05/23/20 1820 05/28/20 0959   05/23/20 1830  remdesivir 200 mg in sodium chloride 0.9% 250 mL IVPB       "Followed by" Linked Group  Details   200 mg 580 mL/hr over 30 Minutes Intravenous Once 05/23/20 1820 05/23/20 2218     Subjective: No new complaints today  Objective: Vitals:   05/25/20 1555 05/25/20 1557 05/25/20 2227 05/26/20 0852  BP:  124/81 129/83 136/80  Pulse: 67 70 67 71  Resp:   16 20  Temp:  98.9 F (37.2 C) 98.7 F (37.1 C) 98.1 F (36.7 C)  TempSrc:   Oral Oral  SpO2: 92% 90% 96% 90%  Weight:      Height:       No intake or output data in the 24 hours ending 05/26/20 1419 Filed Weights   05/23/20 1552  Weight: 68 kg    Examination:  General: No acute distress. Cardiovascular: Heart sounds show Margaret Mclaughlin regular rate, and rhythm.  Lungs: Clear to auscultation bilaterally, faint basilar crackles  Abdomen:  Soft, nontender, nondistended Neurological: Alert and oriented 3. Moves all extremities 4. Cranial nerves II through XII grossly intact. Skin: Warm and dry. No rashes or lesions. Extremities: No clubbing or cyanosis. No edema.     Data Reviewed: I have personally reviewed following labs and imaging studies  CBC: Recent Labs  Lab 05/23/20 1611 05/24/20 0422 05/25/20 0553 05/26/20 0214  WBC 7.6 6.4 8.4 7.9  NEUTROABS 6.0 4.9 6.0 7.2  HGB 13.1 13.7 13.0 12.8  HCT 40.5 42.6 39.6 39.0  MCV 90.6 91.2 89.4 90.3  PLT 164 177 227 274    Basic Metabolic Panel: Recent Labs  Lab 05/23/20 1611 05/24/20 0422 05/25/20 0553 05/26/20 0214  NA 130* 137 138 136  K 4.1 4.6 4.0 4.5  CL 97* 103 104 103  CO2 GLUCOSE 107* 137* 141* 161*  BUN CREATININE 0.47 0.63 0.64 0.57  CALCIUM 8.1* 8.4* 8.3* 8.2*  MG  --  2.0 2.1 2.1    GFR: Estimated Creatinine Clearance: 56.9 mL/min (by C-G formula based on SCr of 0.57 mg/dL).  Liver Function Tests: Recent Labs  Lab 05/23/20 1611 05/24/20 0422 05/25/20 0553 05/26/20 0214  AST 40 42* 42* 46*  ALT ALKPHOS 65 62 53 52  BILITOT 0.6 0.5 0.1* 0.4  PROT 6.6 6.7 6.1* 5.7*  ALBUMIN 2.7* 2.6* 2.4* 2.4*    CBG: Recent Labs  Lab 05/25/20 1139 05/25/20 1557 05/25/20 2130 05/26/20 0849 05/26/20 1121  GLUCAP 124* 142* 158* 141* 141*     Recent Results (from the past 240 hour(s))  SARS Coronavirus 2 by RT PCR (hospital order, performed in [facility] hospital lab) Nasopharyngeal Nasopharyngeal Swab     Status: Abnormal   Collection Time: 05/23/20  4:10 PM   Specimen: Nasopharyngeal Swab  Result Value Ref Range Status   SARS Coronavirus 2 POSITIVE (Adi Doro) NEGATIVE Final    Comment: RESULT CALLED TO, READ BACK BY AND VERIFIED WITH: V GLOSSSON RN 05/23/20 AT 1755 SK (NOTE) SARS-CoV-2 target nucleic acids are DETECTED  SARS-CoV-2 RNA is generally detectable in upper respiratory specimens  during the  acute phase of infection.  Positive results are indicative  of the presence of the identified virus, but do not rule out bacterial infection or co-infection with other pathogens not detected by the test.  Clinical correlation with patient history and  other diagnostic information is necessary to determine patient infection status.  The expected result is negative.  Fact Sheet for Patients:   BoilerBrush.com.cy   Fact Sheet for Healthcare Providers:   https://pope.com/  This test is not yet approved or cleared by the [place] and  has been authorized for detection and/or diagnosis of SARS-CoV-2 by FDA under an Emergency Use Authorization (EUA).  This EUA will remain in effect (meaning this tes t can be used) for the duration of  the COVID-19 declaration under Section 564(b)(1) of the Act, 21 U.S.C. section 360-bbb-3(b)(1), unless the authorization is terminated or revoked sooner.  Performed at [facility], [address] [place], [place] [address]   Blood Culture (routine x 2)     Status: None (Preliminary result)   Collection Time: 05/23/20  4:11 PM   Specimen: BLOOD  Result Value Ref Range Status   Specimen Description BLOOD BLOOD RIGHT FOREARM  Final   Special Requests   Final    BOTTLES DRAWN AEROBIC AND ANAEROBIC Blood Culture adequate volume   Culture   Final    NO GROWTH 3 DAYS Performed at [facility], [address] [place], [place] [address]    Report Status PENDING  Incomplete  Blood Culture (routine x 2)     Status: None (Preliminary result)   Collection Time: 05/23/20  4:11 PM   Specimen: BLOOD  Result Value Ref Range Status   Specimen Description BLOOD BLOOD LEFT FOREARM  Final   Special Requests   Final    BOTTLES DRAWN AEROBIC AND ANAEROBIC Blood Culture adequate volume   Culture   Final    NO GROWTH 3 DAYS Performed at [facility], [address] [place], [place] [address]      Report Status PENDING  Incomplete         Radiology Studies: ECHOCARDIOGRAM LIMITED  Result Date: 05/26/2020    ECHOCARDIOGRAM LIMITED REPORT   Patient Name:   Margaret Mclaughlin Date of Exam: 05/26/2020 Medical Rec #:  [id number]      Height:       61.0 in Accession #:    [id number]     Weight:       150.0 lb Date of Birth:  [birth year]       BSA:          1.671 m Patient Age:    69 years       BP:           129/83 mmHg Patient Gender: F              HR:           64 bpm. Exam Location:  Inpatient Procedure: Limited Echo, Limited Color Doppler and Cardiac Doppler Indications:    Elevated brain natriuretic peptide (BNP) level [970263]  History:        Patient has no prior history of Echocardiogram examinations.                 Risk Factors:Hypertension and Non-Smoker.  Sonographer:    Renella Cunas RDCS Referring Phys: [phone] Fawnda Vitullo CALDWELL POWELL JR  Sonographer Comments: Covid positive. IMPRESSIONS  1. Left ventricular ejection fraction, by estimation, is 60 to 65%. The left ventricle has normal function. The left ventricle has no regional wall motion abnormalities. Left ventricular diastolic parameters were normal.  2. Right ventricular systolic function is normal. The right ventricular size is normal. There is normal pulmonary artery systolic pressure.  3. The mitral valve is normal in structure. Trivial mitral valve regurgitation. No evidence of mitral stenosis.  4. The aortic valve is normal in structure. Aortic valve regurgitation is not visualized. No  aortic stenosis is present. FINDINGS  Left Ventricle: Left ventricular ejection fraction, by estimation, is 60 to 65%. The left ventricle has normal function. The left ventricle has no regional wall motion abnormalities. The left ventricular internal cavity size was normal in size. There is  no left ventricular hypertrophy. Left ventricular diastolic parameters were normal. Right Ventricle: The right ventricular size is normal. No increase in right ventricular  wall thickness. Right ventricular systolic function is normal. There is normal pulmonary artery systolic pressure. The tricuspid regurgitant velocity is 2.35 m/s, and  with an assumed right atrial pressure of 3 mmHg, the estimated right ventricular systolic pressure is 25.1 mmHg. Pericardium: There is no evidence of pericardial effusion. Mitral Valve: The mitral valve is normal in structure. Trivial mitral valve regurgitation. No evidence of mitral valve stenosis. Tricuspid Valve: The tricuspid valve is grossly normal. Tricuspid valve regurgitation is trivial. Aortic Valve: The aortic valve is normal in structure. Aortic valve regurgitation is not visualized. No aortic stenosis is present. Pulmonic Valve: The pulmonic valve was normal in structure. Pulmonic valve regurgitation is not visualized. No evidence of pulmonic stenosis. LEFT VENTRICLE PLAX 2D LVIDd:         5.00 cm     Diastology LVIDs:         3.60 cm     LV e' medial:    7.62 cm/s LV PW:         0.90 cm     LV E/e' medial:  12.8 LV IVS:        0.90 cm     LV e' lateral:   8.27 cm/s LVOT diam:     2.10 cm     LV E/e' lateral: 11.8 LV SV:         85 LV SV Index:   51 LVOT Area:     3.46 cm  LV Volumes (MOD) LV vol d, MOD A2C: 85.6 ml LV vol d, MOD A4C: 90.3 ml LV vol s, MOD A2C: 31.9 ml LV vol s, MOD A4C: 32.8 ml LV SV MOD A2C:     53.7 ml LV SV MOD A4C:     90.3 ml LV SV MOD BP:      56.4 ml LEFT ATRIUM         Index LA diam:    3.20 cm 1.91 cm/m  AORTIC VALVE LVOT Vmax:   90.70 cm/s LVOT Vmean:  58.200 cm/s LVOT VTI:    0.246 m  AORTA Ao Root diam: 3.10 cm MITRAL VALVE               TRICUSPID VALVE MV Area (PHT): 4.39 cm    TR Peak grad:   22.1 mmHg MV Decel Time: 173 msec    TR Vmax:        235.00 cm/s MR Peak grad: 83.2 mmHg MR Vmax:      456.00 cm/s  SHUNTS MV E velocity: 97.30 cm/s  Systemic VTI:  0.25 m MV Nyshawn Gowdy velocity: 66.40 cm/s  Systemic Diam: 2.10 cm MV E/Taft Worthing ratio:  1.47 Kristeen Miss MD Electronically signed by Kristeen Miss MD Signature  Date/Time: 05/26/2020/10:46:32 AM    Final         Scheduled Meds: . baricitinib  4 mg Oral Daily  . enoxaparin (LOVENOX) injection  40 mg Subcutaneous Q24H  . insulin aspart  0-5 Units Subcutaneous QHS  . insulin aspart  0-9 Units Subcutaneous TID WC  . lisinopril  5 mg Oral Daily  . methylPREDNISolone (SOLU-MEDROL)  injection  60 mg Intravenous Q12H   Continuous Infusions: . remdesivir 100 mg in NS 100 mL 100 mg (05/26/20 0952)     LOS: 3 days    Time spent: over 30 min    Lacretia Nicks, MD [facility]   To contact the attending provider between 7A-7P or the covering provider during after hours 7P-7A, please log into the web site [email or web address] and access using universal [place] password for that web site. If you do not have the password, please call the hospital operator.  05/26/2020, 2:19 PM

## 2020-05-26 NOTE — Progress Notes (Signed)
  Echocardiogram 2D Echocardiogram has been performed.  Margaret Mclaughlin 05/26/2020, 10:12 AM

## 2020-05-27 LAB — GLUCOSE, CAPILLARY
Glucose-Capillary: 123 mg/dL — ABNORMAL HIGH (ref 70–99)
Glucose-Capillary: 128 mg/dL — ABNORMAL HIGH (ref 70–99)
Glucose-Capillary: 145 mg/dL — ABNORMAL HIGH (ref 70–99)
Glucose-Capillary: 156 mg/dL — ABNORMAL HIGH (ref 70–99)

## 2020-05-27 NOTE — Progress Notes (Addendum)
PROGRESS NOTE    Margaret Mclaughlin  ZOX:[id number] DOB: 08/14/1951 DOA: 05/23/2020 PCP: Pcp, No   Chief Complaint  Patient presents with   Shortness of Breath    Covid pos   Brief Narrative:  Margaret Mclaughlin 69 y.o.femalewith medical history significant of HTN, who is unfortunately non Vaccinated and developed symptoms of illness about 1 weeks ago, got outpt Infusion but did not show any signs of improvement, continued to be more short of breath and came to the ER where she was diagnosed with acute hypoxic respiratory failure due to COVID-19 pneumonia and she was admitted.  Assessment & Plan:   Active Problems:   COVID-19 virus infection   Pneumonia due to COVID-19 virus   Essential hypertension   Hyperparathyroidism (HCC)   Hyponatremia  Acute Hypoxic Respiratory Failure 2/2 COVID 19 Pneumonia:  Unvaccinated CXR 9/20 with diffuse bilateral GGO  Currently on 3 L , wean as tolerated Continue steroids, baricitinib, remdesivir Strict I/O, daily weights OOB, prone as able, IS, flutter, therapy  COVID-19 Labs  Recent Labs    05/25/20 0553 05/26/20 0214  DDIMER 0.84* 1.04*  CRP 2.1* 1.0*    Lab Results  Component Value Date   SARSCOV2NAA POSITIVE (Boyd Litaker) 05/23/2020   Elevated BNP Follow echo -> normal EF, normal LV diastolic parameters (see report)  Hypertension Continue lisinopril  Hyperparathyroidism Continue to monitor, follow outpatient  Insomnia Try trazodone  DVT prophylaxis: lovenox Code Status: DNR Family Communication: none at bedside - daughter 9/24 Disposition:   Status is: Inpatient  Remains inpatient appropriate because:Inpatient level of care appropriate due to severity of illness   Dispo: The patient is from: Home              Anticipated d/c is to: pending              Anticipated d/c date is: > 3 days              Patient currently is not medically stable to d/c.   Consultants:   none  Procedures:  Echo IMPRESSIONS    1.  Left ventricular ejection fraction, by estimation, is 60 to 65%. The  left ventricle has normal function. The left ventricle has no regional  wall motion abnormalities. Left ventricular diastolic parameters were  normal.  2. Right ventricular systolic function is normal. The right ventricular  size is normal. There is normal pulmonary artery systolic pressure.  3. The mitral valve is normal in structure. Trivial mitral valve  regurgitation. No evidence of mitral stenosis.  4. The aortic valve is normal in structure. Aortic valve regurgitation is  not visualized. No aortic stenosis is present.  Antimicrobials:  Anti-infectives (From admission, onward)   Start     Dose/Rate Route Frequency Ordered Stop   05/24/20 1000  remdesivir 100 mg in sodium chloride 0.9 % 100 mL IVPB       "Followed by" Linked Group Details   100 mg 200 mL/hr over 30 Minutes Intravenous Daily 05/23/20 1820 05/27/20 1050   05/23/20 1830  remdesivir 200 mg in sodium chloride 0.9% 250 mL IVPB       "Followed by" Linked Group Details   200 mg 580 mL/hr over 30 Minutes Intravenous Once 05/23/20 1820 05/23/20 2218     Subjective: No new complaints Feeling Savaughn Karwowski little better  Objective: Vitals:   05/26/20 0852 05/26/20 1631 05/26/20 2300 05/27/20 0759  BP: 136/80 125/78 125/66 131/70  Pulse: 71 72 (!) 52 61  Resp: 20  20 16 19   Temp: 98.1 F (36.7 C) 98.3 F (36.8 C) 97.9 F (36.6 C) 98.1 F (36.7 C)  TempSrc: Oral  Oral   SpO2: 90% 90% 97% 93%  Weight:      Height:       No intake or output data in the 24 hours ending 05/27/20 1449 Filed Weights   05/23/20 1552  Weight: 68 kg    Examination:  General: No acute distress. Cardiovascular: Heart sounds show Lesleigh Hughson regular rate, and rhythm Lungs: Clear to auscultation bilaterally Abdomen: Soft, nontender, nondistended Neurological: Alert and oriented 3. Moves all extremities 4. Cranial nerves II through XII grossly intact. Skin: Warm and dry. No rashes  or lesions. Extremities: No clubbing or cyanosis. No edema.   Data Reviewed: I have personally reviewed following labs and imaging studies  CBC: Recent Labs  Lab 05/23/20 1611 05/24/20 0422 05/25/20 0553 05/26/20 0214  WBC 7.6 6.4 8.4 7.9  NEUTROABS 6.0 4.9 6.0 7.2  HGB 13.1 13.7 13.0 12.8  HCT 40.5 42.6 39.6 39.0  MCV 90.6 91.2 89.4 90.3  PLT 164 177 227 274    Basic Metabolic Panel: Recent Labs  Lab 05/23/20 1611 05/24/20 0422 05/25/20 0553 05/26/20 0214  NA 130* 137 138 136  K 4.1 4.6 4.0 4.5  CL 97* 103 104 103  CO2 22 25 24 26   GLUCOSE 107* 137* 141* 161*  BUN 10 12 17 20   CREATININE 0.47 0.63 0.64 0.57  CALCIUM 8.1* 8.4* 8.3* 8.2*  MG  --  2.0 2.1 2.1    GFR: Estimated Creatinine Clearance: 56.9 mL/min (by C-G formula based on SCr of 0.57 mg/dL).  Liver Function Tests: Recent Labs  Lab 05/23/20 1611 05/24/20 0422 05/25/20 0553 05/26/20 0214  AST 40 42* 42* 46*  ALT 22 25 26 30   ALKPHOS 65 62 53 52  BILITOT 0.6 0.5 0.1* 0.4  PROT 6.6 6.7 6.1* 5.7*  ALBUMIN 2.7* 2.6* 2.4* 2.4*    CBG: Recent Labs  Lab 05/26/20 1121 05/26/20 1631 05/26/20 2103 05/27/20 0719 05/27/20 1206  GLUCAP 141* 182* 114* 123* 128*     Recent Results (from the past 240 hour(s))  SARS Coronavirus 2 by RT PCR (hospital order, performed in [facility] hospital lab) Nasopharyngeal Nasopharyngeal Swab     Status: Abnormal   Collection Time: 05/23/20  4:10 PM   Specimen: Nasopharyngeal Swab  Result Value Ref Range Status   SARS Coronavirus 2 POSITIVE (Juanya Villavicencio) NEGATIVE Final    Comment: RESULT CALLED TO, READ BACK BY AND VERIFIED WITH: V GLOSSSON RN 05/23/20 AT 1755 SK (NOTE) SARS-CoV-2 target nucleic acids are DETECTED  SARS-CoV-2 RNA is generally detectable in upper respiratory specimens  during the acute phase of infection.  Positive results are indicative  of the presence of the identified virus, but do not rule out bacterial infection or co-infection with other  pathogens not detected by the test.  Clinical correlation with patient history and  other diagnostic information is necessary to determine patient infection status.  The expected result is negative.  Fact Sheet for Patients:   05/29/20   Fact Sheet for Healthcare Providers:   [facility]    This test is not yet approved or cleared by the 05/25/20 FDA and  has been authorized for detection and/or diagnosis of SARS-CoV-2 by FDA under an Emergency Use Authorization (EUA).  This EUA will remain in effect (meaning this tes t can be used) for the duration of  the COVID-19 declaration under Section 564(b)(1)  of the Act, 21 U.S.C. section 360-bbb-3(b)(1), unless the authorization is terminated or revoked sooner.  Performed at [facility], [address] [place], [place] [address]   Blood Culture (routine x 2)     Status: None (Preliminary result)   Collection Time: 05/23/20  4:11 PM   Specimen: BLOOD  Result Value Ref Range Status   Specimen Description BLOOD BLOOD RIGHT FOREARM  Final   Special Requests   Final    BOTTLES DRAWN AEROBIC AND ANAEROBIC Blood Culture adequate volume   Culture   Final    NO GROWTH 3 DAYS Performed at [facility], [address] [place], [place] [address]    Report Status PENDING  Incomplete  Blood Culture (routine x 2)     Status: None (Preliminary result)   Collection Time: 05/23/20  4:11 PM   Specimen: BLOOD  Result Value Ref Range Status   Specimen Description BLOOD BLOOD LEFT FOREARM  Final   Special Requests   Final    BOTTLES DRAWN AEROBIC AND ANAEROBIC Blood Culture adequate volume   Culture   Final    NO GROWTH 3 DAYS Performed at [facility], [address] [place], [place] [address]    Report Status PENDING  Incomplete         Radiology Studies: ECHOCARDIOGRAM LIMITED  Result Date: 05/26/2020    ECHOCARDIOGRAM LIMITED REPORT   Patient Name:    Simon RheinGLENDA D Tith Date of Exam: 05/26/2020 Medical Rec #:  [id number]      Height:       61.0 in Accession #:    [id number]     Weight:       150.0 lb Date of Birth:  03/01/1951       BSA:          1.671 m Patient Age:    69 years       BP:           129/83 mmHg Patient Gender: F              HR:           64 bpm. Exam Location:  Inpatient Procedure: Limited Echo, Limited Color Doppler and Cardiac Doppler Indications:    Elevated brain natriuretic peptide (BNP) level [[id number]][[id number]]  History:        Patient has no prior history of Echocardiogram examinations.                 Risk Factors:Hypertension and Non-Smoker.  Sonographer:    Renella CunasJulia Swaim RDCS Referring Phys: [phone] [id number] Chenell Lozon CALDWELL POWELL JR  Sonographer Comments: Covid positive. IMPRESSIONS  1. Left ventricular ejection fraction, by estimation, is 60 to 65%. The left ventricle has normal function. The left ventricle has no regional wall motion abnormalities. Left ventricular diastolic parameters were normal.  2. Right ventricular systolic function is normal. The right ventricular size is normal. There is normal pulmonary artery systolic pressure.  3. The mitral valve is normal in structure. Trivial mitral valve regurgitation. No evidence of mitral stenosis.  4. The aortic valve is normal in structure. Aortic valve regurgitation is not visualized. No aortic stenosis is present. FINDINGS  Left Ventricle: Left ventricular ejection fraction, by estimation, is 60 to 65%. The left ventricle has normal function. The left ventricle has no regional wall motion abnormalities. The left ventricular internal cavity size was normal in size. There is  no left ventricular hypertrophy. Left ventricular diastolic parameters were normal. Right Ventricle:  The right ventricular size is normal. No increase in right ventricular wall thickness. Right ventricular systolic function is normal. There is normal pulmonary artery systolic pressure. The tricuspid regurgitant velocity is 2.35 m/s, and  with  an assumed right atrial pressure of 3 mmHg, the estimated right ventricular systolic pressure is 25.1 mmHg. Pericardium: There is no evidence of pericardial effusion. Mitral Valve: The mitral valve is normal in structure. Trivial mitral valve regurgitation. No evidence of mitral valve stenosis. Tricuspid Valve: The tricuspid valve is grossly normal. Tricuspid valve regurgitation is trivial. Aortic Valve: The aortic valve is normal in structure. Aortic valve regurgitation is not visualized. No aortic stenosis is present. Pulmonic Valve: The pulmonic valve was normal in structure. Pulmonic valve regurgitation is not visualized. No evidence of pulmonic stenosis. LEFT VENTRICLE PLAX 2D LVIDd:         5.00 cm     Diastology LVIDs:         3.60 cm     LV e' medial:    7.62 cm/s LV PW:         0.90 cm     LV E/e' medial:  12.8 LV IVS:        0.90 cm     LV e' lateral:   8.27 cm/s LVOT diam:     2.10 cm     LV E/e' lateral: 11.8 LV SV:         85 LV SV Index:   51 LVOT Area:     3.46 cm  LV Volumes (MOD) LV vol d, MOD A2C: 85.6 ml LV vol d, MOD A4C: 90.3 ml LV vol s, MOD A2C: 31.9 ml LV vol s, MOD A4C: 32.8 ml LV SV MOD A2C:     53.7 ml LV SV MOD A4C:     90.3 ml LV SV MOD BP:      56.4 ml LEFT ATRIUM         Index LA diam:    3.20 cm 1.91 cm/m  AORTIC VALVE LVOT Vmax:   90.70 cm/s LVOT Vmean:  58.200 cm/s LVOT VTI:    0.246 m  AORTA Ao Root diam: 3.10 cm MITRAL VALVE               TRICUSPID VALVE MV Area (PHT): 4.39 cm    TR Peak grad:   22.1 mmHg MV Decel Time: 173 msec    TR Vmax:        235.00 cm/s MR Peak grad: 83.2 mmHg MR Vmax:      456.00 cm/s  SHUNTS MV E velocity: 97.30 cm/s  Systemic VTI:  0.25 m MV Strummer Canipe velocity: 66.40 cm/s  Systemic Diam: 2.10 cm MV E/Fiora Weill ratio:  1.47 Kristeen Miss MD Electronically signed by Kristeen Miss MD Signature Date/Time: 05/26/2020/10:46:32 AM    Final         Scheduled Meds:  baricitinib  4 mg Oral Daily   enoxaparin (LOVENOX) injection  40 mg Subcutaneous Q24H   insulin  aspart  0-5 Units Subcutaneous QHS   insulin aspart  0-9 Units Subcutaneous TID WC   lisinopril  5 mg Oral Daily   methylPREDNISolone (SOLU-MEDROL) injection  60 mg Intravenous Q12H   traZODone  50 mg Oral QHS   Continuous Infusions:    LOS: 4 days    Time spent: over 30 min    Lacretia Nicks, MD [facility]   To contact the attending provider between 7A-7P or the covering provider during after hours 7P-7A, please  log into the web site [email or web address] and access using universal [place] password for that web site. If you do not have the password, please call the hospital operator.  05/27/2020, 2:49 PM

## 2020-05-27 NOTE — Progress Notes (Signed)
SATURATION QUALIFICATIONS: (This note is used to comply with regulatory documentation for home oxygen)  Patient Saturations on Room Air at Rest = 84%  Patient Saturations on Room Air while Ambulating = unable to assess due to RA O2 at rest  Patient Saturations on 3 Liters of oxygen while Ambulating = 88%  Please briefly explain why patient needs home oxygen: to maintain SpO2 >=88%, maintained SpO2 88-94% on 3LO2 during mobility this day.   Richrd Sox, PT Acute Rehabilitation Services Pager [phone]  Office [phone]

## 2020-05-27 NOTE — Progress Notes (Signed)
Physical Therapy Treatment Patient Details Name: Margaret Mclaughlin MRN: [id number] DOB: 1951/02/25 Today's Date: 05/27/2020    History of Present Illness 69 yo female with PMH of HTN presents to ED on 9/20 with covid pneumonitis, pt with moderate to severe parenchymal lung injury. Pt is unvaccinated.    PT Comments    Pt with improving activity tolerance this day, ambulating 600 ft total with intermittent standing rest breaks to recover dyspnea. Pt with good application of pursed lip breathing technique. Pt required 3LO2 to maintain SpO2 >=88% during mobility. Pt progressing well, no follow up PT needs anticipated.      Follow Up Recommendations  No PT follow up;Supervision - Intermittent     Equipment Recommendations  None recommended by PT    Recommendations for Other Services       Precautions / Restrictions Precautions Precautions: None Precaution Comments: sats Restrictions Weight Bearing Restrictions: No    Mobility  Bed Mobility               General bed mobility comments: up in recliner  Transfers Overall transfer level: Modified independent     Sit to Stand: Modified independent (Device/Increase time)         General transfer comment: Mod I for increased time to rise, pt very cautious.  Ambulation/Gait Ambulation/Gait assistance: Supervision Gait Distance (Feet): 600 Feet (x6 standing rest breaks) Assistive device: None Gait Pattern/deviations: Step-through pattern;Decreased stride length Gait velocity: decr   General Gait Details: supervision for safety, pt cuing for self-regulating standing rest breaks and breathing technique to recover. Spo2 88-94% on 3LO2 during mobility, DOE 2/4.   Stairs             Wheelchair Mobility    Modified Rankin (Stroke Patients Only)       Balance Overall balance assessment: Mild deficits observed, not formally tested   Sitting balance-Leahy Scale: Normal       Standing balance-Leahy Scale:  Good                              Cognition Arousal/Alertness: Awake/alert Behavior During Therapy: WFL for tasks assessed/performed Overall Cognitive Status: Within Functional Limits for tasks assessed                                        Exercises      General Comments        Pertinent Vitals/Pain Pain Assessment: No/denies pain    Home Living                      Prior Function            PT Goals (current goals can now be found in the care plan section) Acute Rehab PT Goals Patient Stated Goal: go home PT Goal Formulation: With patient Time For Goal Achievement: 06/08/20 Potential to Achieve Goals: Good Progress towards PT goals: Progressing toward goals    Frequency    Min 3X/week      PT Plan Current plan remains appropriate    Co-evaluation              AM-PAC PT "6 Clicks" Mobility   Outcome Measure  Help needed turning from your back to your side while in a flat bed without using bedrails?: None Help needed moving from lying on your back to  sitting on the side of a flat bed without using bedrails?: None Help needed moving to and from a bed to a chair (including a wheelchair)?: A Little Help needed standing up from a chair using your arms (e.g., wheelchair or bedside chair)?: None Help needed to walk in hospital room?: A Little Help needed climbing 3-5 steps with a railing? : A Little 6 Click Score: 21    End of Session Equipment Utilized During Treatment: Oxygen Activity Tolerance: Patient tolerated treatment well Patient left: with call bell/phone within reach;in chair Nurse Communication: Mobility status PT Visit Diagnosis: Other abnormalities of gait and mobility (R26.89)     Time: 1541-1600 PT Time Calculation (min) (ACUTE ONLY): 19 min  Charges:  $Gait Training: 8-22 mins                    [name], PT Acute Rehabilitation Services Pager [phone]  Office [phone]    [name]  [name] 05/27/2020, 4:49 PM

## 2020-05-27 NOTE — Progress Notes (Signed)
Patient assessed.  Reviewed LPN documentation, and agree with the findings.  

## 2020-05-28 LAB — CULTURE, BLOOD (ROUTINE X 2)
Culture: NO GROWTH
Culture: NO GROWTH
Special Requests: ADEQUATE
Special Requests: ADEQUATE

## 2020-05-28 LAB — CBC WITH DIFFERENTIAL/PLATELET
Abs Immature Granulocytes: 0.04 10*3/uL (ref 0.00–0.07)
Basophils Absolute: 0 10*3/uL (ref 0.0–0.1)
Basophils Relative: 0 %
Eosinophils Absolute: 0 10*3/uL (ref 0.0–0.5)
Eosinophils Relative: 0 %
HCT: 39.1 % (ref 36.0–46.0)
Hemoglobin: 12.8 g/dL (ref 12.0–15.0)
Immature Granulocytes: 1 %
Lymphocytes Relative: 23 %
Lymphs Abs: 1.5 10*3/uL (ref 0.7–4.0)
MCH: 29.1 pg (ref 26.0–34.0)
MCHC: 32.7 g/dL (ref 30.0–36.0)
MCV: 88.9 fL (ref 80.0–100.0)
Monocytes Absolute: 0.7 10*3/uL (ref 0.1–1.0)
Monocytes Relative: 10 %
Neutro Abs: 4.3 10*3/uL (ref 1.7–7.7)
Neutrophils Relative %: 66 %
Platelets: 291 10*3/uL (ref 150–400)
RBC: 4.4 MIL/uL (ref 3.87–5.11)
RDW: 12.4 % (ref 11.5–15.5)
WBC: 6.5 10*3/uL (ref 4.0–10.5)
nRBC: 0 % (ref 0.0–0.2)

## 2020-05-28 LAB — COMPREHENSIVE METABOLIC PANEL
ALT: 39 U/L (ref 0–44)
AST: 32 U/L (ref 15–41)
Albumin: 2.4 g/dL — ABNORMAL LOW (ref 3.5–5.0)
Alkaline Phosphatase: 52 U/L (ref 38–126)
Anion gap: 5 (ref 5–15)
BUN: 23 mg/dL (ref 8–23)
CO2: 30 mmol/L (ref 22–32)
Calcium: 8.3 mg/dL — ABNORMAL LOW (ref 8.9–10.3)
Chloride: 101 mmol/L (ref 98–111)
Creatinine, Ser: 0.62 mg/dL (ref 0.44–1.00)
GFR calc Af Amer: >60 mL/min (ref >60)
GFR calc non Af Amer: >60 mL/min (ref >60)
Glucose, Bld: 118 mg/dL — ABNORMAL HIGH (ref 70–99)
Potassium: 4.2 mmol/L (ref 3.5–5.1)
Sodium: 136 mmol/L (ref 135–145)
Total Bilirubin: 0.6 mg/dL (ref 0.3–1.2)
Total Protein: 5.6 g/dL — ABNORMAL LOW (ref 6.5–8.1)

## 2020-05-28 LAB — GLUCOSE, CAPILLARY
Glucose-Capillary: 97 mg/dL (ref 70–99)
Glucose-Capillary: 124 mg/dL — ABNORMAL HIGH (ref 70–99)

## 2020-05-28 LAB — BRAIN NATRIURETIC PEPTIDE: B Natriuretic Peptide: 144.7 pg/mL — ABNORMAL HIGH (ref 0.0–100.0)

## 2020-05-28 LAB — PROCALCITONIN: Procalcitonin: <0.1 ng/mL

## 2020-05-28 LAB — MAGNESIUM: Magnesium: 2.4 mg/dL (ref 1.7–2.4)

## 2020-05-28 LAB — D-DIMER, QUANTITATIVE: D-Dimer, Quant: 0.57 ug/mL-FEU — ABNORMAL HIGH (ref 0.00–0.50)

## 2020-05-28 LAB — C-REACTIVE PROTEIN: CRP: 0.5 mg/dL (ref <1.0)

## 2020-05-28 MED ORDER — PREDNISONE 10 MG PO TABS: ORAL_TABLET | ORAL | 0 refills | Status: AC

## 2020-05-28 NOTE — TOC Transition Note (Signed)
Transition of Care [facility]) - CM/SW Discharge Note   Patient Details  Name: Margaret Mclaughlin MRN: [id number] Date of Birth: [birth year]  [facility] CM/SW Contact:  Kermit Balo, RN Phone Number: 05/28/2020, 3:09 PM   Clinical Narrative:    Pt discharging home with self care. Pt with orders for home oxygen. CM spoke with pts daughter (unable to get pt on the phone) and [employer] selected for DME. Zack with [employer] notified and will have oxygen delivered to the room and home.  Daughter to provide transport home.    Final next level of care: Home/Self Care Barriers to Discharge: No Barriers Identified   Patient Goals and CMS Choice     Choice offered to / list presented to : Adult Children  Discharge Placement                       Discharge Plan and Services                DME Arranged: Oxygen DME Agency: [employer] Date DME Agency Contacted: 05/28/20   Representative spoke with at DME Agency: Zack            Social Determinants of Health (SDOH) Interventions     Readmission Risk Interventions No flowsheet data found.

## 2020-05-28 NOTE — Progress Notes (Signed)
Saturation qualifications for home oxygen: Patient Saturations on Room Air at Rest = 95-94%  Patient Saturations on [employer] while Ambulating = pt felt fine with no complaints of shortness of breath but she dropped as low as 87 on RA and had to be place back on 2L to substrain at least 93% during ambulation.

## 2020-05-28 NOTE — Discharge Instructions (Signed)
Person Under Monitoring Name: Margaret Mclaughlin  Location: [address] [place] [address]   Infection Prevention Recommendations for Individuals Confirmed to have, or Being Evaluated for, 2019 Novel Coronavirus (COVID-19) Infection Who Receive Care at Home  Individuals who are confirmed to have, or are being evaluated for, COVID-19 should follow the prevention steps below until [place] healthcare provider or local or state health department says they can return to normal activities.  Stay home except to get medical care You should restrict activities outside your home, except for getting medical care. Do not go to work, school, or public areas, and do not use public transportation or taxis.  Call ahead before visiting your doctor Before your medical appointment, call the healthcare provider and tell them that you have, or are being evaluated for, COVID-19 infection. This will help the healthcare provider's office take steps to keep other people from getting infected. Ask your healthcare provider to call the local or state health department.  Monitor your symptoms Seek prompt medical attention if your illness is worsening (e.g., difficulty breathing). Before going to your medical appointment, call the healthcare provider and tell them that you have, or are being evaluated for, COVID-19 infection. Ask your healthcare provider to call the local or state health department.  Wear Noura Purpura facemask You should wear Yennifer Segovia facemask that covers your nose and mouth when you are in the same room with other people and when you visit Mickenzie Stolar healthcare provider. People who live with or visit you should also wear Lacheryl Niesen facemask while they are in the same room with you.  Separate yourself from other people in your home As much as possible, you should stay in Casmere Hollenbeck different room from other people in your home. Also, you should use Sydell Prowell separate bathroom, if available.  Avoid sharing household items You should not  share dishes, drinking glasses, cups, eating utensils, towels, bedding, or other items with other people in your home. After using these items, you should wash them thoroughly with soap and water.  Cover your coughs and sneezes Cover your mouth and nose with Manpreet Kemmer tissue when you cough or sneeze, or you can cough or sneeze into your sleeve. Throw used tissues in Yatzary Merriweather lined trash can, and immediately wash your hands with soap and water for at least 20 seconds or use an alcohol-based hand rub.  Wash your [employer] your hands often and thoroughly with soap and water for at least 20 seconds. You can use an alcohol-based hand sanitizer if soap and water are not available and if your hands are not visibly dirty. Avoid touching your eyes, nose, and mouth with unwashed hands.   Prevention Steps for Caregivers and Household Members of Individuals Confirmed to have, or Being Evaluated for, COVID-19 Infection Being Cared for in the Home  If you live with, or provide care at home for, [place] person confirmed to have, or being evaluated for, COVID-19 infection please follow these guidelines to prevent infection:  Follow healthcare provider's instructions Make sure that you understand and can help the patient follow any healthcare provider instructions for all care.  Provide for the patient's basic needs You should help the patient with basic needs in the home and provide support for getting groceries, prescriptions, and other personal needs.  Monitor the patient's symptoms If they are getting sicker, call his or her medical provider and tell them that the patient has, or is being evaluated for, COVID-19 infection. This will help the healthcare provider's  office take steps to keep other people from getting infected. Ask the healthcare provider to call the local or state health department.  Limit the number of people who have contact with the patient  If possible, have only one caregiver for the  patient.  Other household members should stay in another home or place of residence. If this is not possible, they should stay  in another room, or be separated from the patient as much as possible. Use Rihana Kiddy separate bathroom, if available.  Restrict visitors who do not have an essential need to be in the home.  Keep older adults, very young children, and other sick people away from the patient Keep older adults, very young children, and those who have compromised immune systems or chronic health conditions away from the patient. This includes people with chronic heart, lung, or kidney conditions, diabetes, and cancer.  Ensure good ventilation Make sure that shared spaces in the home have good air flow, such as from an air conditioner or an opened window, weather permitting.  Wash your hands often  Wash your hands often and thoroughly with soap and water for at least 20 seconds. You can use an alcohol based hand sanitizer if soap and water are not available and if your hands are not visibly dirty.  Avoid touching your eyes, nose, and mouth with unwashed hands.  Use disposable paper towels to dry your hands. If not available, use dedicated cloth towels and replace them when they become wet.  Wear Cherice Glennie facemask and gloves  Wear Keirstyn Aydt disposable facemask at all times in the room and gloves when you touch or have contact with the patient's blood, body fluids, and/or secretions or excretions, such as sweat, saliva, sputum, nasal mucus, vomit, urine, or feces.  Ensure the mask fits over your nose and mouth tightly, and do not touch it during use.  Throw out disposable facemasks and gloves after using them. Do not reuse.  Wash your hands immediately after removing your facemask and gloves.  If your personal clothing becomes contaminated, carefully remove clothing and launder. Wash your hands after handling contaminated clothing.  Place all used disposable facemasks, gloves, and other waste in Zaira Iacovelli lined  container before disposing them with other household waste.  Remove gloves and wash your hands immediately after handling these items.  Do not share dishes, glasses, or other household items with the patient  Avoid sharing household items. You should not share dishes, drinking glasses, cups, eating utensils, towels, bedding, or other items with Chaney Ingram patient who is confirmed to have, or being evaluated for, COVID-19 infection.  After the person uses these items, you should wash them thoroughly with soap and water.  Wash laundry thoroughly  Immediately remove and wash clothes or bedding that have blood, body fluids, and/or secretions or excretions, such as sweat, saliva, sputum, nasal mucus, vomit, urine, or feces, on them.  Wear gloves when handling laundry from the patient.  Read and follow directions on labels of laundry or clothing items and detergent. In general, wash and dry with the warmest temperatures recommended on the label.  Clean all areas the individual has used often  Clean all touchable surfaces, such as counters, tabletops, doorknobs, bathroom fixtures, toilets, phones, keyboards, tablets, and bedside tables, every day. Also, clean any surfaces that may have blood, body fluids, and/or secretions or excretions on them.  Wear gloves when cleaning surfaces the patient has come in contact with.  Use Rebel Laughridge diluted bleach solution (e.g., dilute bleach with 1  part bleach and 10 parts water) or Talbot Monarch household disinfectant with Elva Mauro label that says EPA-registered for coronaviruses. To make Willena Jeancharles bleach solution at home, add 1 tablespoon of bleach to 1 quart (4 cups) of water. For Janiel Crisostomo larger supply, add  cup of bleach to 1 gallon (16 cups) of water.  Read labels of cleaning products and follow recommendations provided on product labels. Labels contain instructions for safe and effective use of the cleaning product including precautions you should take when applying the product, such as wearing gloves or  eye protection and making sure you have good ventilation during use of the product.  Remove gloves and wash hands immediately after cleaning.  Monitor yourself for signs and symptoms of illness Caregivers and household members are considered close contacts, should monitor their health, and will be asked to limit movement outside of the home to the extent possible. Follow the monitoring steps for close contacts listed on the symptom monitoring form.   ? If you have additional questions, contact your local health department or call the epidemiologist on call at [phone] (available 24/7). ? This guidance is subject to change. For the most up-to-date guidance from [facility], please refer to their website: [email or web address]

## 2020-05-28 NOTE — Discharge Summary (Signed)
Physician Discharge Summary  DARRION MACAULAY ERD:[id number] DOB: 1951/05/04 DOA: 05/23/2020  PCP: Pcp, No  Admit date: 05/23/2020 Discharge date: 05/28/2020  Time spent: 40 minutes  Recommendations for Outpatient Follow-up:  1. Follow up outpatient CMP/CBC  2. Follow outpatient CXR 3. Quarantine per [employer] guidelines (daughter notes first positive test was 9/15, she can discontinue her quarantine after 06/08/2020 if symptoms continue to improve) 4. Wean oxygen as tolerated outpatient with PCP  Discharge Diagnoses:  Active Problems:   COVID-19 virus infection   Pneumonia due to COVID-19 virus   Essential hypertension   Hyperparathyroidism (HCC)   Hyponatremia   Discharge Condition: stable  Diet recommendation: heart healthy  Filed Weights   05/23/20 1552  Weight: 68 kg   History of present illness:  Elisa D Smithis Facundo Allemand 69 y.o.femalewith medical history significant of HTN, who is unfortunately non Vaccinated and developed symptoms of illness about 1 weeks ago, got outpt Infusion but did not showany signs of improvement, continued to be more short of breath and came to the ER where she was diagnosed with acute hypoxic respiratory failure due to COVID-19 pneumonia and she was admitted.  She was admitted for COVID 19 pneumonia.  She received mab prior to admission, but worsened.  She's improved here on steroids, remdesivir, and baricitinib.  She'll discharge today with steroid taper and supplemental oxygen to use with activity.    See below for additional details  Hospital Course:  Acute Hypoxic Respiratory Failure 2/2 COVID 19 Pneumonia:  Unvaccinated CXR 9/20 with diffuse bilateral GGO  On RA at rest, requiring 2 L with activity Remdesivir 5 day course complete.  D/c baricitinib at discharge.  Discharge with steroid taper. Strict I/O, daily weights OOB, prone as able, IS, flutter, therapy  COVID-19 Labs  Recent Labs    05/26/20 0214 05/28/20 0416  DDIMER 1.04* 0.57*  CRP  1.0* 0.5    Lab Results  Component Value Date   SARSCOV2NAA POSITIVE (Malley Hauter) 05/23/2020   Elevated BNP Follow echo -> normal EF, normal LV diastolic parameters (see report)  Hypertension Continue lisinopril  Hyperparathyroidism Continue to monitor, follow outpatient  Insomnia Try trazodone  Procedures: Echo IMPRESSIONS    1. Left ventricular ejection fraction, by estimation, is 60 to 65%. The  left ventricle has normal function. The left ventricle has no regional  wall motion abnormalities. Left ventricular diastolic parameters were  normal.  2. Right ventricular systolic function is normal. The right ventricular  size is normal. There is normal pulmonary artery systolic pressure.  3. The mitral valve is normal in structure. Trivial mitral valve  regurgitation. No evidence of mitral stenosis.  4. The aortic valve is normal in structure. Aortic valve regurgitation is  not visualized. No aortic stenosis is present.   Consultations:  none  Discharge Exam: Vitals:   05/27/20 2242 05/28/20 0816  BP: 137/83 118/80  Pulse:  66  Resp:  16  Temp: 98.2 F (36.8 C) 98 F (36.7 C)  SpO2: 97% (!) 89%   Feels better Breathing feels at baseline Discussed poc with daughter  General: No acute distress. Cardiovascular: Heart sounds show Annalaura Sauseda regular rate, and rhythm.  Lungs: Clear to auscultation bilaterally  Abdomen: Soft, nontender, nondistended Neurological: Alert and oriented 3. Moves all extremities 4 . Cranial nerves II through XII grossly intact. Skin: Warm and dry. No rashes or lesions. Extremities: No clubbing or cyanosis. No edema  Discharge Instructions   Discharge Instructions    Call MD for:  difficulty breathing, headache  or visual disturbances   Complete by: As directed    Call MD for:  extreme fatigue   Complete by: As directed    Call MD for:  hives   Complete by: As directed    Call MD for:  persistant dizziness or light-headedness   Complete  by: As directed    Call MD for:  persistant nausea and vomiting   Complete by: As directed    Call MD for:  redness, tenderness, or signs of infection (pain, swelling, redness, odor or green/yellow discharge around incision site)   Complete by: As directed    Call MD for:  severe uncontrolled pain   Complete by: As directed    Call MD for:  temperature >100.4   Complete by: As directed    Diet - low sodium heart healthy   Complete by: As directed    Discharge instructions   Complete by: As directed    You were seen for COVID 19 pneumonia.  You've improved with steroids, remdesivir, and baricitinib.  We'll send you home with Terreon Ekholm steroid taper.  You need oxygen when your active, also use this at night when you're sleeping.  At rest, you can be on room air (use 2 L with activity and during sleep).  You need to continue to quarantine until 21 days after your first positive test (this will be 06/08/2020, you can discontinue your quarantine after this date as long as you've continued to improve.  Return for new, recurrent, or worsening symptoms.  Please ask your PCP to request records from this hospitalization so they know what was done and what the next steps will be.   Increase activity slowly   Complete by: As directed      Allergies as of 05/28/2020      Reactions   Hydrocodone-acetaminophen Nausea And Vomiting   Oxycodone-acetaminophen Nausea And Vomiting   Gluten Meal Nausea And Vomiting   Hydrocodone Nausea And Vomiting   Oxycodone Nausea And Vomiting      Medication List    TAKE these medications   acetaminophen 500 MG tablet Commonly known as: TYLENOL Take 1,000 mg by mouth every 6 (six) hours as needed for fever or headache (pain).   ciprofloxacin 500 MG tablet Commonly known as: CIPRO Take 500 mg by mouth 2 (two) times daily.   lisinopril 5 MG tablet Commonly known as: ZESTRIL Take 5 mg by mouth daily.   MAGNESIUM PO Take 1 tablet by mouth 2 (two) times daily with  breakfast and lunch.   OVER THE COUNTER MEDICATION Take 1 packet by mouth every morning. Gentle Health vitamin pak   OVER THE COUNTER MEDICATION Take 1 tablet by mouth 2 (two) times daily. Total Eye Bright   OVER THE COUNTER MEDICATION Take 1 tablet by mouth 2 (two) times daily. "Caramide"   predniSONE 10 MG tablet Commonly known as: DELTASONE Take 4 tablets (40 mg total) by mouth daily for 2 days, THEN 3 tablets (30 mg total) daily for 2 days, THEN 2 tablets (20 mg total) daily for 2 days, THEN 1 tablet (10 mg total) daily for 2 days. Start taking on: May 28, 2020   Refresh 1.4-0.6 % Soln Generic drug: Polyvinyl Alcohol-Povidone PF Place 1 drop into both eyes 3 (three) times daily.   VITAMIN C PO Take 1-2 tablets by mouth See admin instructions. Take 2 tablets by mouth with breakfast and lunch, take 1 tablet with supper   VITAMIN D3 PO Take 1 tablet by mouth  2 (two) times daily with breakfast and lunch.   Zinc 30 MG Tabs Take 30 mg by mouth 3 (three) times daily with meals.            Durable Medical Equipment  (From admission, onward)         Start     Ordered   05/28/20 1448  DME Oxygen  Once       Comments: SATURATION QUALIFICATIONS: (This note is used to comply with regulatory documentation for home oxygen)  Patient Saturations on Room Air at Rest = 94%  Patient Saturations on Room Air while Ambulating = 87%   Patient Saturations on 2 Liters of oxygen while Ambulating = 93%  Please briefly explain why patient needs home oxygen: to maintain SpO2 >=88%, she requires 2 L by [place]  Question Answer Comment  Length of Need 6 Months   Liters per Minute 2   Frequency Continuous (stationary and portable oxygen unit needed)   Oxygen delivery system Gas      05/28/20 1449         Allergies  Allergen Reactions  . Hydrocodone-Acetaminophen Nausea And Vomiting  . Oxycodone-Acetaminophen Nausea And Vomiting  . Gluten Meal Nausea And Vomiting  .  Hydrocodone Nausea And Vomiting  . Oxycodone Nausea And Vomiting    Follow-up Information    Pcp, No.   Contact information: Jerold Coombe, FNP               The results of significant diagnostics from this hospitalization (including imaging, microbiology, ancillary and laboratory) are listed below for reference.    Significant Diagnostic Studies: DG Chest Port 1 View  Result Date: 05/23/2020 CLINICAL DATA:  Shortness of breath.  COVID positive. EXAM: PORTABLE CHEST 1 VIEW COMPARISON:  None. FINDINGS: There are diffuse bilateral ground-glass airspace opacities. No pneumothorax. No significant pleural effusion. There is cardiac enlargement with probable atherosclerotic changes of the thoracic aorta. There is an old healed fracture of the mid right clavicle. There is Araeya Lamb calcification in the left upper quadrant favored to represent Signora Zucco calcified and tortuous splenic artery. IMPRESSION: Diffuse bilateral ground-glass airspace opacities consistent with the patient's history of viral pneumonia. Electronically Signed   By: Katherine Mantle M.D.   On: 05/23/2020 15:29   ECHOCARDIOGRAM LIMITED  Result Date: 05/26/2020    ECHOCARDIOGRAM LIMITED REPORT   Patient Name:   KELI BUEHNER Date of Exam: 05/26/2020 Medical Rec #:  [id number]      Height:       61.0 in Accession #:    [id number]     Weight:       150.0 lb Date of Birth:  09/14/1950       BSA:          1.671 m Patient Age:    69 years       BP:           129/83 mmHg Patient Gender: F              HR:           64 bpm. Exam Location:  Inpatient Procedure: Limited Echo, Limited Color Doppler and Cardiac Doppler Indications:    Elevated brain natriuretic peptide (BNP) level [[id number]]  History:        Patient has no prior history of Echocardiogram examinations.                 Risk Factors:Hypertension and Non-Smoker.  Sonographer:    Renella Cunas RDCS Referring  Phys: [id number] Wandy Bossler CALDWELL POWELL JR  Sonographer Comments: Covid positive. IMPRESSIONS  1.  Left ventricular ejection fraction, by estimation, is 60 to 65%. The left ventricle has normal function. The left ventricle has no regional wall motion abnormalities. Left ventricular diastolic parameters were normal.  2. Right ventricular systolic function is normal. The right ventricular size is normal. There is normal pulmonary artery systolic pressure.  3. The mitral valve is normal in structure. Trivial mitral valve regurgitation. No evidence of mitral stenosis.  4. The aortic valve is normal in structure. Aortic valve regurgitation is not visualized. No aortic stenosis is present. FINDINGS  Left Ventricle: Left ventricular ejection fraction, by estimation, is 60 to 65%. The left ventricle has normal function. The left ventricle has no regional wall motion abnormalities. The left ventricular internal cavity size was normal in size. There is  no left ventricular hypertrophy. Left ventricular diastolic parameters were normal. Right Ventricle: The right ventricular size is normal. No increase in right ventricular wall thickness. Right ventricular systolic function is normal. There is normal pulmonary artery systolic pressure. The tricuspid regurgitant velocity is 2.35 m/s, and  with an assumed right atrial pressure of 3 mmHg, the estimated right ventricular systolic pressure is 25.1 mmHg. Pericardium: There is no evidence of pericardial effusion. Mitral Valve: The mitral valve is normal in structure. Trivial mitral valve regurgitation. No evidence of mitral valve stenosis. Tricuspid Valve: The tricuspid valve is grossly normal. Tricuspid valve regurgitation is trivial. Aortic Valve: The aortic valve is normal in structure. Aortic valve regurgitation is not visualized. No aortic stenosis is present. Pulmonic Valve: The pulmonic valve was normal in structure. Pulmonic valve regurgitation is not visualized. No evidence of pulmonic stenosis. LEFT VENTRICLE PLAX 2D LVIDd:         5.00 cm     Diastology LVIDs:          3.60 cm     LV e' medial:    7.62 cm/s LV PW:         0.90 cm     LV E/e' medial:  12.8 LV IVS:        0.90 cm     LV e' lateral:   8.27 cm/s LVOT diam:     2.10 cm     LV E/e' lateral: 11.8 LV SV:         85 LV SV Index:   51 LVOT Area:     3.46 cm  LV Volumes (MOD) LV vol d, MOD A2C: 85.6 ml LV vol d, MOD A4C: 90.3 ml LV vol s, MOD A2C: 31.9 ml LV vol s, MOD A4C: 32.8 ml LV SV MOD A2C:     53.7 ml LV SV MOD A4C:     90.3 ml LV SV MOD BP:      56.4 ml LEFT ATRIUM         Index LA diam:    3.20 cm 1.91 cm/m  AORTIC VALVE LVOT Vmax:   90.70 cm/s LVOT Vmean:  58.200 cm/s LVOT VTI:    0.246 m  AORTA Ao Root diam: 3.10 cm MITRAL VALVE               TRICUSPID VALVE MV Area (PHT): 4.39 cm    TR Peak grad:   22.1 mmHg MV Decel Time: 173 msec    TR Vmax:        235.00 cm/s MR Peak grad: 83.2 mmHg MR Vmax:      456.00 cm/s  SHUNTS MV  E velocity: 97.30 cm/s  Systemic VTI:  0.25 m MV Cataleya Cristina velocity: 66.40 cm/s  Systemic Diam: 2.10 cm MV E/Vyolet Sakuma ratio:  1.47 Kristeen Miss MD Electronically signed by Kristeen Miss MD Signature Date/Time: 05/26/2020/10:46:32 AM    Final     Microbiology: Recent Results (from the past 240 hour(s))  SARS Coronavirus 2 by RT PCR (hospital order, performed in [facility] hospital lab) Nasopharyngeal Nasopharyngeal Swab     Status: Abnormal   Collection Time: 05/23/20  4:10 PM   Specimen: Nasopharyngeal Swab  Result Value Ref Range Status   SARS Coronavirus 2 POSITIVE (Deyana Wnuk) NEGATIVE Final    Comment: RESULT CALLED TO, READ BACK BY AND VERIFIED WITH: V GLOSSSON RN 05/23/20 AT 1755 SK (NOTE) SARS-CoV-2 target nucleic acids are DETECTED  SARS-CoV-2 RNA is generally detectable in upper respiratory specimens  during the acute phase of infection.  Positive results are indicative  of the presence of the identified virus, but do not rule out bacterial infection or co-infection with other pathogens not detected by the test.  Clinical correlation with patient history and  other diagnostic  information is necessary to determine patient infection status.  The expected result is negative.  Fact Sheet for Patients:   BoilerBrush.com.cy   Fact Sheet for Healthcare Providers:   https://pope.com/    This test is not yet approved or cleared by the [place] FDA and  has been authorized for detection and/or diagnosis of SARS-CoV-2 by FDA under an Emergency Use Authorization (EUA).  This EUA will remain in effect (meaning this tes t can be used) for the duration of  the COVID-19 declaration under Section 564(b)(1) of the Act, 21 U.S.C. section 360-bbb-3(b)(1), unless the authorization is terminated or revoked sooner.  Performed at [facility], [address], [place], [place] [address]   Blood Culture (routine x 2)     Status: None   Collection Time: 05/23/20  4:11 PM   Specimen: BLOOD  Result Value Ref Range Status   Specimen Description BLOOD BLOOD RIGHT FOREARM  Final   Special Requests   Final    BOTTLES DRAWN AEROBIC AND ANAEROBIC Blood Culture adequate volume   Culture   Final    NO GROWTH 5 DAYS Performed at [facility], [address], [place], [place] [address]    Report Status 05/28/2020 FINAL  Final  Blood Culture (routine x 2)     Status: None   Collection Time: 05/23/20  4:11 PM   Specimen: BLOOD  Result Value Ref Range Status   Specimen Description BLOOD BLOOD LEFT FOREARM  Final   Special Requests   Final    BOTTLES DRAWN AEROBIC AND ANAEROBIC Blood Culture adequate volume   Culture   Final    NO GROWTH 5 DAYS Performed at [facility], [address] [place], [place] [address]    Report Status 05/28/2020 FINAL  Final     Labs: Basic Metabolic Panel: Recent Labs  Lab 05/23/20 1611 05/24/20 0422 05/25/20 0553 05/26/20 0214 05/28/20 0416  NA 130* 137 138 136 136  K 4.1 4.6 4.0 4.5 4.2  CL 97* 103 104 103 101  CO2 22 25 24 26 30   GLUCOSE 107* 137* 141* 161* 118*  BUN 10  12 17 20 23   CREATININE 0.47 0.63 0.64 0.57 0.62  CALCIUM 8.1* 8.4* 8.3* 8.2* 8.3*  MG  --  2.0 2.1 2.1 2.4   Liver Function Tests: Recent Labs  Lab 05/23/20 1611  05/24/20 0422 05/25/20 0553 05/26/20 0214 05/28/20 0416  AST 40 42* 42* 46* 32  ALT 39  ALKPHOS 65 62 53 52 52  BILITOT 0.6 0.5 0.1* 0.4 0.6  PROT 6.6 6.7 6.1* 5.7* 5.6*  ALBUMIN 2.7* 2.6* 2.4* 2.4* 2.4*   No results for input(s): LIPASE, AMYLASE in the last 168 hours. No results for input(s): AMMONIA in the last 168 hours. CBC: Recent Labs  Lab 05/23/20 1611 05/24/20 0422 05/25/20 0553 05/26/20 0214 05/28/20 0416  WBC 7.6 6.4 8.4 7.9 6.5  NEUTROABS 6.0 4.9 6.0 7.2 4.3  HGB 13.1 13.7 13.0 12.8 12.8  HCT 40.5 42.6 39.6 39.0 39.1  MCV 90.6 91.2 89.4 90.3 88.9  PLT 164 177 227 274 291   Cardiac Enzymes: No results for input(s): CKTOTAL, CKMB, CKMBINDEX, TROPONINI in the last 168 hours. BNP: BNP (last 3 results) Recent Labs    05/25/20 0553 05/26/20 0214 05/28/20 0416  BNP 218.1* 208.2* 144.7*    ProBNP (last 3 results) No results for input(s): PROBNP in the last 8760 hours.  CBG: Recent Labs  Lab 05/27/20 1206 05/27/20 1614 05/27/20 2120 05/28/20 0818 05/28/20 1159  GLUCAP 128* 145* 156* 97 124*       Signed:  Lacretia Nicks MD.  [facility] 05/28/2020, 3:04 PM

## 2021-04-21 ENCOUNTER — Emergency Department (HOSPITAL_BASED_OUTPATIENT_CLINIC_OR_DEPARTMENT_OTHER): Payer: Medicare Other

## 2021-04-21 ENCOUNTER — Other Ambulatory Visit: Payer: Self-pay

## 2021-04-21 ENCOUNTER — Encounter (HOSPITAL_BASED_OUTPATIENT_CLINIC_OR_DEPARTMENT_OTHER): Payer: Self-pay | Admitting: *Deleted

## 2021-04-21 ENCOUNTER — Emergency Department (HOSPITAL_BASED_OUTPATIENT_CLINIC_OR_DEPARTMENT_OTHER)
Admission: EM | Admit: 2021-04-21 | Discharge: 2021-04-21 | Disposition: A | Discharge status: Home / Self Care | Payer: Medicare Other | Attending: Emergency Medicine | Admitting: Emergency Medicine

## 2021-04-21 DIAGNOSIS — I10 Essential (primary) hypertension: Secondary | ICD-10-CM | POA: Insufficient documentation

## 2021-04-21 DIAGNOSIS — Z8616 Personal history of COVID-19: Secondary | ICD-10-CM | POA: Diagnosis not present

## 2021-04-21 DIAGNOSIS — W19XXXA Unspecified fall, initial encounter: Secondary | ICD-10-CM

## 2021-04-21 DIAGNOSIS — S300XXA Contusion of lower back and pelvis, initial encounter: Secondary | ICD-10-CM | POA: Insufficient documentation

## 2021-04-21 DIAGNOSIS — W010XXA Fall on same level from slipping, tripping and stumbling without subsequent striking against object, initial encounter: Secondary | ICD-10-CM | POA: Insufficient documentation

## 2021-04-21 DIAGNOSIS — N83299 Other ovarian cyst, unspecified side: Secondary | ICD-10-CM | POA: Insufficient documentation

## 2021-04-21 DIAGNOSIS — Z79899 Other long term (current) drug therapy: Secondary | ICD-10-CM | POA: Diagnosis not present

## 2021-04-21 DIAGNOSIS — S3992XA Unspecified injury of lower back, initial encounter: Secondary | ICD-10-CM | POA: Diagnosis present

## 2021-04-21 DIAGNOSIS — N949 Unspecified condition associated with female genital organs and menstrual cycle: Secondary | ICD-10-CM

## 2021-04-21 HISTORY — DX: Essential (primary) hypertension: I10

## 2021-04-21 NOTE — ED Triage Notes (Addendum)
She tripped and fell a week ago. Pain in her coccyx when she walks or bends over. She had an xray after the fall that was negative.

## 2021-04-21 NOTE — ED Provider Notes (Signed)
[facility] EMERGENCY DEPARTMENT Provider Note   CSN: [id number] Arrival date & time: 04/21/21  1237     History Chief Complaint  Patient presents with   Margaret Mclaughlin is a 70 y.o. female.  She had a fall about 10 days ago and since then has had pain in her tailbone.  Worse with ambulation bending and lifting things.  Her PCP had her get an x-ray that was reportedly negative.  She continues to have ongoing pain.  No numbness or weakness.  No bowel or bladder incontinence.  No back pain.  No abdominal pain.  The history is provided by the patient and a relative.  Fall This is a new problem. The current episode started more than 1 week ago. The problem has not changed since onset.Pertinent negatives include no chest pain, no abdominal pain, no headaches and no shortness of breath. The symptoms are aggravated by bending. Nothing relieves the symptoms. She has tried rest for the symptoms. The treatment provided no relief.      Past Medical History:  Diagnosis Date   Hypertension     Patient Active Problem List   Diagnosis Date Noted   COVID-19 virus infection 05/23/2020   Pneumonia due to COVID-19 virus 05/23/2020   Essential hypertension 05/23/2020   Hyperparathyroidism (HCC) 05/23/2020   Hyponatremia 05/23/2020    Past Surgical History:  Procedure Laterality Date   ABDOMINAL HYSTERECTOMY     BLADDER SURGERY     CHOLECYSTECTOMY     KNEE SURGERY       OB History   No obstetric history on file.     Family History  Problem Relation Age of Onset   Hypertension Other     Social History   Tobacco Use   Smoking status: Never   Smokeless tobacco: Never  Vaping Use   Vaping Use: Never used  Substance Use Topics   Alcohol use: Never   Drug use: Never    Home Medications Prior to Admission medications   Medication Sig Start Date End Date Taking? Authorizing Provider  Ascorbic Acid (VITAMIN C PO) Take 1-2 tablets by mouth See admin  instructions. Take 2 tablets by mouth with breakfast and lunch, take 1 tablet with supper   Yes [provider]  Cholecalciferol (VITAMIN D3 PO) Take 1 tablet by mouth 2 (two) times daily with breakfast and lunch.   Yes [provider]  lisinopril (ZESTRIL) 5 MG tablet Take 5 mg by mouth daily. 05/16/20  Yes [provider]  Zinc 30 MG TABS Take 30 mg by mouth 3 (three) times daily with meals.   Yes [provider]  acetaminophen (TYLENOL) 500 MG tablet Take 1,000 mg by mouth every 6 (six) hours as needed for fever or headache (pain).    [provider]  ciprofloxacin (CIPRO) 500 MG tablet Take 500 mg by mouth 2 (two) times daily. 05/17/20   [provider]  MAGNESIUM PO Take 1 tablet by mouth 2 (two) times daily with breakfast and lunch.    [provider]  OVER THE COUNTER MEDICATION Take 1 packet by mouth every morning. Gentle Health vitamin pak    [provider]  OVER THE COUNTER MEDICATION Take 1 tablet by mouth 2 (two) times daily. Total Eye Bright    [provider]  OVER THE COUNTER MEDICATION Take 1 tablet by mouth 2 (two) times daily. "Caramide"    [provider]  Polyvinyl Alcohol-Povidone PF (REFRESH) 1.4-0.6 %  SOLN Place 1 drop into both eyes 3 (three) times daily.    [provider]    Allergies    Hydrocodone-acetaminophen, Oxycodone-acetaminophen, Gluten meal, Hydrocodone, and Oxycodone  Review of Systems   Review of Systems  Constitutional:  Negative for fever.  HENT:  Negative for sore throat.   Eyes:  Negative for visual disturbance.  Respiratory:  Negative for shortness of breath.   Cardiovascular:  Negative for chest pain.  Gastrointestinal:  Negative for abdominal pain.  Genitourinary:  Negative for dysuria.  Musculoskeletal:  Negative for back pain and neck pain.  Skin:  Negative for rash.  Neurological:  Negative for headaches.   Physical Exam Updated Vital  Signs BP (!) 158/100 (BP Location: Right Arm)   Pulse 91   Temp 98.4 F (36.9 C) (Oral)   Resp 18   Ht 5\' 1"  (1.549 m)   Wt 71.2 kg   SpO2 95%   BMI 29.66 kg/m   Physical Exam Vitals and nursing note reviewed.  Constitutional:      General: She is not in acute distress.    Appearance: Normal appearance. She is well-developed.  HENT:     Head: Normocephalic and atraumatic.  Eyes:     Conjunctiva/sclera: Conjunctivae normal.  Cardiovascular:     Rate and Rhythm: Normal rate and regular rhythm.     Heart sounds: No murmur heard. Pulmonary:     Effort: Pulmonary effort is normal. No respiratory distress.     Breath sounds: Normal breath sounds.  Abdominal:     Palpations: Abdomen is soft.     Tenderness: There is no abdominal tenderness. There is no guarding or rebound.  Musculoskeletal:        General: Tenderness present. Normal range of motion.     Cervical back: Neck supple.     Comments: She is tender over her sacrum and a little bit at the coccyx.  There is no hip pain or lumbar tenderness.  She is ambulatory without any difficulty.  Skin:    General: Skin is warm and dry.  Neurological:     General: No focal deficit present.     Mental Status: She is alert.     Sensory: No sensory deficit.     Motor: No weakness.     Gait: Gait normal.    ED Results / Procedures / Treatments   Labs (all labs ordered are listed, but only abnormal results are displayed) Labs Reviewed - No data to display  EKG None  Radiology CT PELVIS WO CONTRAST  Result Date: 04/21/2021 CLINICAL DATA:  Pelvic trauma, sacral and coccyx pain. EXAM: CT PELVIS WITHOUT CONTRAST TECHNIQUE: Multidetector CT imaging of the pelvis was performed following the standard protocol without intravenous contrast. COMPARISON:  None. FINDINGS: Urinary Tract: Signs of pelvic floor dysfunction with cystocele extending well below the pubococcygeal line. No distal ureteral dilation. Bowel: No acute gastrointestinal  process to the extent evaluated on pelvic CT. Vascular/Lymphatic: Scattered atheromatous plaque in the distal aorta and extending into iliac vessels. Insert no pelvic adenopathy Reproductive: 4.0 x 3.6 x 5.1 cm cystic LEFT adnexal lesion, potentially with septation along its upper margin. Post hysterectomy. RIGHT adnexa unremarkable. Other:  No ascites Musculoskeletal: Post ORIF of LEFT femur with dynamic hip screw and intramedullary rod partially visualized. No signs of acute pelvic fracture. Lumbar spine degenerative changes are incidentally noted, incompletely evaluated with facet arthropathy at L4-5 and L5-S1. IMPRESSION: 1. No acute pelvic fracture. 2. Cystic LEFT adnexal lesion potentially septated  and slightly greater than 5 cm. In this postmenopausal female with suggest follow-up pelvic sonogram, given potential septation would suggest short interval follow-up, within 6-8 weeks. 3. Signs of large cystocele.  Correlate with any urinary symptoms. 4. Post ORIF of LEFT femur with dynamic hip screw and intramedullary rod partially visualized. 5. Lumbar spine degenerative changes, incompletely evaluated with facet arthropathy at L4-5 and L5-S1. Aortic Atherosclerosis (ICD10-I70.0). Electronically Signed   By: Donzetta Kohut M.D.   On: 04/21/2021 14:36    Procedures Procedures   Medications Ordered in ED Medications - No data to display  ED Course  I have reviewed the triage vital signs and the nursing notes.  Pertinent labs & imaging results that were available during my care of the patient were reviewed by me and considered in my medical decision making (see chart for details).    MDM Rules/Calculators/A&P                          Differential includes Kostic fracture, sacral fracture, pelvic rami fracture, dislocation, contusion, back strain.  CT pelvis ordered and interpreted by me as no acute fractures.  Radiology has also identified some incidental findings that were reviewed with patient and  daughter.  They will follow-up with her primary care doctor and continue symptomatic treatment.  Return instructions discussed  Final Clinical Impression(s) / ED Diagnoses Final diagnoses:  Fall, initial encounter  Contusion of sacrum, initial encounter  Adnexal cyst    Rx / DC Orders ED Discharge Orders     None        Terrilee Files, MD 04/21/21 1758

## 2021-04-21 NOTE — Discharge Instructions (Addendum)
You were seen in the emergency department for continued pain to her tailbone after a fall.  You had a CAT scan of your pelvis that did not show any acute fractures.  There were a few incidental findings listed below and these will require follow-up with your primary care doctor and your urologist.  You can continue Tylenol for pain and you can do a warm compress to the area.  Return to the emergency department if any worsening or concerning symptoms  IMPRESSION:  1. No acute pelvic fracture.  2. Cystic LEFT adnexal lesion potentially septated and slightly  greater than 5 cm. In this postmenopausal female with suggest  follow-up pelvic sonogram, given potential septation would suggest  short interval follow-up, within 6-8 weeks.  3. Signs of large cystocele.  Correlate with any urinary symptoms.  4. Post ORIF of LEFT femur with dynamic hip screw and intramedullary  rod partially visualized.  5. Lumbar spine degenerative changes, incompletely evaluated with  facet arthropathy at L4-5 and L5-S1.

## 2021-04-27 ENCOUNTER — Encounter (HOSPITAL_BASED_OUTPATIENT_CLINIC_OR_DEPARTMENT_OTHER): Payer: Self-pay

## 2021-04-27 ENCOUNTER — Inpatient Hospital Stay (HOSPITAL_BASED_OUTPATIENT_CLINIC_OR_DEPARTMENT_OTHER)
Admission: EM | Admit: 2021-04-27 | Discharge: 2021-05-04 | DRG: 025 | Disposition: A | Discharge status: Home / Self Care | Payer: Medicare Other | Attending: Family Medicine | Admitting: Family Medicine

## 2021-04-27 ENCOUNTER — Emergency Department (HOSPITAL_BASED_OUTPATIENT_CLINIC_OR_DEPARTMENT_OTHER): Payer: Medicare Other

## 2021-04-27 ENCOUNTER — Other Ambulatory Visit: Payer: Self-pay

## 2021-04-27 DIAGNOSIS — Z885 Allergy status to narcotic agent status: Secondary | ICD-10-CM

## 2021-04-27 DIAGNOSIS — I10 Essential (primary) hypertension: Secondary | ICD-10-CM | POA: Diagnosis present

## 2021-04-27 DIAGNOSIS — S065X9A Traumatic subdural hemorrhage with loss of consciousness of unspecified duration, initial encounter: Secondary | ICD-10-CM | POA: Diagnosis present

## 2021-04-27 DIAGNOSIS — I6200 Nontraumatic subdural hemorrhage, unspecified: Secondary | ICD-10-CM | POA: Diagnosis not present

## 2021-04-27 DIAGNOSIS — N949 Unspecified condition associated with female genital organs and menstrual cycle: Secondary | ICD-10-CM

## 2021-04-27 DIAGNOSIS — Z66 Do not resuscitate: Secondary | ICD-10-CM | POA: Diagnosis present

## 2021-04-27 DIAGNOSIS — I6203 Nontraumatic chronic subdural hemorrhage: Principal | ICD-10-CM | POA: Diagnosis present

## 2021-04-27 DIAGNOSIS — Z91018 Allergy to other foods: Secondary | ICD-10-CM

## 2021-04-27 DIAGNOSIS — G935 Compression of brain: Secondary | ICD-10-CM | POA: Diagnosis present

## 2021-04-27 DIAGNOSIS — Z20822 Contact with and (suspected) exposure to covid-19: Secondary | ICD-10-CM | POA: Diagnosis present

## 2021-04-27 DIAGNOSIS — Z79899 Other long term (current) drug therapy: Secondary | ICD-10-CM

## 2021-04-27 DIAGNOSIS — E209 Hypoparathyroidism, unspecified: Secondary | ICD-10-CM | POA: Diagnosis present

## 2021-04-27 DIAGNOSIS — Z8249 Family history of ischemic heart disease and other diseases of the circulatory system: Secondary | ICD-10-CM

## 2021-04-27 DIAGNOSIS — S065XAA Traumatic subdural hemorrhage with loss of consciousness status unknown, initial encounter: Secondary | ICD-10-CM

## 2021-04-27 DIAGNOSIS — Z8616 Personal history of COVID-19: Secondary | ICD-10-CM

## 2021-04-27 LAB — COMPREHENSIVE METABOLIC PANEL
ALT: 20 U/L (ref 0–44)
AST: 21 U/L (ref 15–41)
Albumin: 3.7 g/dL (ref 3.5–5.0)
Alkaline Phosphatase: 139 U/L — ABNORMAL HIGH (ref 38–126)
Anion gap: 9 (ref 5–15)
BUN: 22 mg/dL (ref 8–23)
CO2: 26 mmol/L (ref 22–32)
Calcium: 9.1 mg/dL (ref 8.9–10.3)
Chloride: 102 mmol/L (ref 98–111)
Creatinine, Ser: 0.72 mg/dL (ref 0.44–1.00)
GFR, Estimated: >60 mL/min (ref >60)
Glucose, Bld: 103 mg/dL — ABNORMAL HIGH (ref 70–99)
Potassium: 3.6 mmol/L (ref 3.5–5.1)
Sodium: 137 mmol/L (ref 135–145)
Total Bilirubin: 0.3 mg/dL (ref 0.3–1.2)
Total Protein: 7.5 g/dL (ref 6.5–8.1)

## 2021-04-27 LAB — CBC WITH DIFFERENTIAL/PLATELET
Abs Immature Granulocytes: 0.03 10*3/uL (ref 0.00–0.07)
Basophils Absolute: 0.1 10*3/uL (ref 0.0–0.1)
Basophils Relative: 1 %
Eosinophils Absolute: 0.3 10*3/uL (ref 0.0–0.5)
Eosinophils Relative: 3 %
HCT: 42.7 % (ref 36.0–46.0)
Hemoglobin: 14.2 g/dL (ref 12.0–15.0)
Immature Granulocytes: 0 %
Lymphocytes Relative: 28 %
Lymphs Abs: 3.2 10*3/uL (ref 0.7–4.0)
MCH: 30.2 pg (ref 26.0–34.0)
MCHC: 33.3 g/dL (ref 30.0–36.0)
MCV: 90.9 fL (ref 80.0–100.0)
Monocytes Absolute: 1.4 10*3/uL — ABNORMAL HIGH (ref 0.1–1.0)
Monocytes Relative: 12 %
Neutro Abs: 6.4 10*3/uL (ref 1.7–7.7)
Neutrophils Relative %: 56 %
Platelets: 291 10*3/uL (ref 150–400)
RBC: 4.7 MIL/uL (ref 3.87–5.11)
RDW: 12.7 % (ref 11.5–15.5)
WBC: 11.4 10*3/uL — ABNORMAL HIGH (ref 4.0–10.5)
nRBC: 0 % (ref 0.0–0.2)

## 2021-04-27 LAB — RESP PANEL BY RT-PCR (FLU A&B, COVID) ARPGX2
Influenza A by PCR: NEGATIVE
Influenza B by PCR: NEGATIVE
SARS Coronavirus 2 by RT PCR: NEGATIVE

## 2021-04-27 MED ORDER — LEVETIRACETAM 500 MG PO TABS
500.0000 mg | ORAL_TABLET | Freq: Two times a day (BID) | ORAL | Status: DC
  Administered 2021-04-27 – 2021-04-28 (×2): 500 mg via ORAL
  Filled 2021-04-27 (×2): qty 1

## 2021-04-27 MED ORDER — METOCLOPRAMIDE HCL 5 MG/ML IJ SOLN: 10.0000 mg | Freq: Once | INTRAMUSCULAR | Status: DC

## 2021-04-27 MED ORDER — DIPHENHYDRAMINE HCL 50 MG/ML IJ SOLN: 25.0000 mg | Freq: Once | INTRAMUSCULAR | Status: DC

## 2021-04-27 MED ORDER — SODIUM CHLORIDE 0.9 % IV BOLUS: 1000.0000 mL | Freq: Once | INTRAVENOUS | Status: DC

## 2021-04-27 NOTE — ED Provider Notes (Addendum)
[facility] EMERGENCY DEPARTMENT Provider Note   CSN: [id number] Arrival date & time: 04/27/21  1938     History Chief Complaint  Patient presents with   Headache    Margaret Mclaughlin is a 70 y.o. female history of hypertension here presenting with headache.  Patient states that she had a mechanical fall about 2 weeks ago.  She states that she was in the garden and fell onto her left hip area.  She adamantly denies any neck injury or head injury at that time.  She states that she saw her chiropractor 2 days later and had a massage in the area.  She states that her neck has been hurting worse since then.  Patient has been having headaches since then.  Patient states that for the last 4 days her headaches been constant.  She states that it is frontal in nature.  She states that sharp and been taking Tylenol and ibuprofen with minimal relief.  She states that it is worse when she walks around. She has some low-grade temperature as well.  Patient is not on any blood thinners.  Denies any weakness or numbness.  The history is provided by the patient.      Past Medical History:  Diagnosis Date   Hypertension     Patient Active Problem List   Diagnosis Date Noted   COVID-19 virus infection 05/23/2020   Pneumonia due to COVID-19 virus 05/23/2020   Essential hypertension 05/23/2020   Hyperparathyroidism (HCC) 05/23/2020   Hyponatremia 05/23/2020    Past Surgical History:  Procedure Laterality Date   ABDOMINAL HYSTERECTOMY     BLADDER SURGERY     CHOLECYSTECTOMY     KNEE SURGERY       OB History   No obstetric history on file.     Family History  Problem Relation Age of Onset   Hypertension Other     Social History   Tobacco Use   Smoking status: Never   Smokeless tobacco: Never  Vaping Use   Vaping Use: Never used  Substance Use Topics   Alcohol use: Never   Drug use: Never    Home Medications Prior to Admission medications   Medication Sig Start Date  End Date Taking? Authorizing Provider  acetaminophen (TYLENOL) 500 MG tablet Take 1,000 mg by mouth every 6 (six) hours as needed for fever or headache (pain).    [provider]  Ascorbic Acid (VITAMIN C PO) Take 1-2 tablets by mouth See admin instructions. Take 2 tablets by mouth with breakfast and lunch, take 1 tablet with supper    [provider]  Cholecalciferol (VITAMIN D3 PO) Take 1 tablet by mouth 2 (two) times daily with breakfast and lunch.    [provider]  ciprofloxacin (CIPRO) 500 MG tablet Take 500 mg by mouth 2 (two) times daily. 05/17/20   [provider]  lisinopril (ZESTRIL) 5 MG tablet Take 5 mg by mouth daily. 05/16/20   [provider]  MAGNESIUM PO Take 1 tablet by mouth 2 (two) times daily with breakfast and lunch.    [provider]  OVER THE COUNTER MEDICATION Take 1 packet by mouth every morning. Gentle Health vitamin pak    [provider]  OVER THE COUNTER MEDICATION Take 1 tablet by mouth 2 (two) times daily. Total Eye Bright    [provider]  OVER THE COUNTER MEDICATION Take 1 tablet by mouth 2 (two) times daily. "Caramide"    [provider]  Polyvinyl Alcohol-Povidone PF (REFRESH) 1.4-0.6 % SOLN Place 1 drop into both eyes 3 (three) times daily.    [provider]  Zinc 30 MG TABS Take 30 mg by mouth 3 (three) times daily with meals.    [provider]    Allergies    Hydrocodone-acetaminophen, Oxycodone-acetaminophen, Gluten meal, Hydrocodone, and Oxycodone  Review of Systems   Review of Systems  Neurological:  Positive for headaches.  All other systems reviewed and are negative.  Physical Exam Updated Vital Signs BP (!) 147/100   Pulse 86   Temp 98.5 F (36.9 C) (Oral)   Resp (!) 24   Ht 5\' 1"  (1.549 m)   Wt 70.8 kg   SpO2 96%   BMI 29.48 kg/m   Physical Exam Vitals and nursing note reviewed.  Constitutional:      Comments: Slightly  uncomfortable  HENT:     Head: Normocephalic.     Mouth/Throat:     Mouth: Mucous membranes are moist.  Eyes:     Extraocular Movements: Extraocular movements intact.     Pupils: Pupils are equal, round, and reactive to light.  Neck:     Comments: No meningeal signs.  Normal range of motion of the neck Cardiovascular:     Rate and Rhythm: Normal rate and regular rhythm.  Pulmonary:     Effort: Pulmonary effort is normal.     Breath sounds: Normal breath sounds.  Abdominal:     General: Abdomen is flat.     Palpations: Abdomen is soft.  Musculoskeletal:        General: Normal range of motion.     Cervical back: Normal range of motion and neck supple.  Skin:    General: Skin is warm.     Capillary Refill: Capillary refill takes less than 2 seconds.  Neurological:     General: No focal deficit present.     Mental Status: She is alert and oriented to person, place, and time.     Cranial Nerves: No cranial nerve deficit.     Sensory: No sensory deficit.     Motor: No weakness.     Coordination: Coordination normal.     Comments: Cranial nerves II to XII intact.  Patient has normal strength and sensation bilaterally.   Psychiatric:        Mood and Affect: Mood normal.        Behavior: Behavior normal.    ED Results / Procedures / Treatments   Labs (all labs ordered are listed, but only abnormal results are displayed) Labs Reviewed  CBC WITH DIFFERENTIAL/PLATELET - Abnormal; Notable for the following components:      Result Value   WBC 11.4 (*)    Monocytes Absolute 1.4 (*)    All other components within normal limits  RESP PANEL BY RT-PCR (FLU A&B, COVID) [id number]  COMPREHENSIVE METABOLIC PANEL    EKG None  Radiology CT HEAD WO CONTRAST (5MM)  Result Date: 04/27/2021 CLINICAL DATA:  Headache, intracranial hemorrhage suspected Headache for 2 weeks.  No known injury. Per technologist notes, patient had a fall 2 weeks ago. EXAM: CT HEAD WITHOUT CONTRAST TECHNIQUE:  Contiguous axial images were obtained from the base of the skull through the vertex without intravenous contrast. COMPARISON:  Brain MRI 06/12/2018 FINDINGS: Brain: Right-sided holo hemispheric subdural collection measuring 10 mm in the frontal region, series 4, image 48, and 9 mm in the parietal region series 4, image 23. This is primarily isodense to CSF, however a  few hyperdense components are seen internally. There is 6 mm right to left midline shift. Possible thin isodense subdural collection in the left parietal lobe, measuring up to 5 mm series 4, image 27. Alternatively this may simply represent prominent CSF. No evidence of intraparenchymal or intraventricular blood. No acute ischemia. No hydrocephalus. Basilar cisterns are patent. Vascular: No hyperdense vessel. Skull: No fracture or focal lesion. Sinuses/Orbits: Paranasal sinuses are clear. Trace opacification of lower left mastoid air cells, chronic. The visualized orbits are unremarkable. Bilateral cataract resection. Other: None. IMPRESSION: 1. Right-sided holo hemispheric subdural collection measuring 10 mm in the frontal region and 9 mm in the parietal region. This is primarily isodense to CSF, however a few hyperdense components are seen internally. This may represent a subacute or chronic subdural hematoma with residual acute blood products versus subdural hygroma. There is 6 mm right to left midline shift. 2. Possible thin isodense subdural collection in the left parietal lobe, measuring up to 5 mm. Alternatively this may simply represent prominent CSF. These results were called by telephone at the time of interpretation on 04/27/2021 at 10:09 pm to provider Caydee Talkington , who verbally acknowledged these results. Electronically Signed   By: Narda Rutherford M.D.   On: 04/27/2021 22:09   CT Cervical Spine Wo Contrast  Result Date: 04/27/2021 CLINICAL DATA:  Neck pain, acute, no red flags EXAM: CT CERVICAL SPINE WITHOUT CONTRAST TECHNIQUE:  Multidetector CT imaging of the cervical spine was performed without intravenous contrast. Multiplanar CT image reconstructions were also generated. COMPARISON:  None. FINDINGS: Alignment: Trace anterolisthesis of C5 on C6 and C6 on C7. Skull base and vertebrae: No acute fracture. Vertebral body heights are maintained. The dens and skull base are intact. Soft tissues and spinal canal: No prevertebral fluid or swelling. No visible canal hematoma. Disc levels: Minor endplate spurring at multiple levels with mild disc space narrowing at C6-C7. Occasional facet hypertrophy. No high-grade canal stenosis. Upper chest: No acute findings. Mild biapical pleuroparenchymal scarring. Other: None. IMPRESSION: Mild degenerative change in the cervical spine without acute fracture or subluxation. Electronically Signed   By: Narda Rutherford M.D.   On: 04/27/2021 22:12    Procedures Procedures   CRITICAL CARE Performed by: Richardean Canal   Total critical care time: 30 minutes  Critical care time was exclusive of separately billable procedures and treating other patients.  Critical care was necessary to treat or prevent imminent or life-threatening deterioration.  Critical care was time spent personally by me on the following activities: development of treatment plan with patient and/or surrogate as well as nursing, discussions with consultants, evaluation of patient's response to treatment, examination of patient, obtaining history from patient or surrogate, ordering and performing treatments and interventions, ordering and review of laboratory studies, ordering and review of radiographic studies, pulse oximetry and re-evaluation of patient's condition.   Medications Ordered in ED Medications  sodium chloride 0.9 % bolus 1,000 mL (has no administration in time range)  metoCLOPramide (REGLAN) injection 10 mg (has no administration in time range)  diphenhydrAMINE (BENADRYL) injection 25 mg (has no administration in  time range)    ED Course  I have reviewed the triage vital signs and the nursing notes.  Pertinent labs & imaging results that were available during my care of the patient were reviewed by me and considered in my medical decision making (see chart for details).    MDM Rules/Calculators/A&P  Margaret Mclaughlin is a 70 y.o. female here presenting with headaches.  Consider migraines versus subdural.  Patient had a fall 2 weeks ago but did not hit her head at that time. Patient was seen 6 days ago in the ED and there were no pelvic fractures. Will get CT head/neck, labs. 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  Discussed case with Dr. [name] from neurosurgery.  He recommend hospitalist admission and monitoring on stepdown for neurochecks.  He also recommend MRI brain when he gets to [facility].  He states that he will see patient as a consult.  Hospitalist to admit  Final Clinical Impression(s) / ED Diagnoses Final diagnoses:  None    Rx / DC Orders ED Discharge Orders     None        Charlynne Pander, MD 04/27/21 2315    Charlynne Pander, MD 04/27/21 [phone]

## 2021-04-27 NOTE — ED Triage Notes (Signed)
Pt c/o HA across forehead and behind left eye x 2 weeks-denies injury to head/neck-reports vomited x 1 yesterday and low grade fever today-NAD-steady gait

## 2021-04-27 NOTE — ED Notes (Signed)
Patient states pain 0/10 and decline medication and IV fluids at this time. Dr Silverio Lay aware.

## 2021-04-28 ENCOUNTER — Observation Stay (HOSPITAL_COMMUNITY): Payer: Medicare Other

## 2021-04-28 ENCOUNTER — Encounter (HOSPITAL_COMMUNITY): Payer: Self-pay | Admitting: Family Medicine

## 2021-04-28 ENCOUNTER — Other Ambulatory Visit: Payer: Self-pay | Admitting: Neurosurgery

## 2021-04-28 DIAGNOSIS — S065XAA Traumatic subdural hemorrhage with loss of consciousness status unknown, initial encounter: Secondary | ICD-10-CM | POA: Diagnosis present

## 2021-04-28 DIAGNOSIS — S065X9A Traumatic subdural hemorrhage with loss of consciousness of unspecified duration, initial encounter: Secondary | ICD-10-CM

## 2021-04-28 DIAGNOSIS — I10 Essential (primary) hypertension: Secondary | ICD-10-CM | POA: Diagnosis present

## 2021-04-28 DIAGNOSIS — Z20822 Contact with and (suspected) exposure to covid-19: Secondary | ICD-10-CM | POA: Diagnosis present

## 2021-04-28 DIAGNOSIS — Z885 Allergy status to narcotic agent status: Secondary | ICD-10-CM | POA: Diagnosis not present

## 2021-04-28 DIAGNOSIS — Z79899 Other long term (current) drug therapy: Secondary | ICD-10-CM | POA: Diagnosis not present

## 2021-04-28 DIAGNOSIS — I6200 Nontraumatic subdural hemorrhage, unspecified: Secondary | ICD-10-CM | POA: Diagnosis present

## 2021-04-28 DIAGNOSIS — Z66 Do not resuscitate: Secondary | ICD-10-CM | POA: Diagnosis present

## 2021-04-28 DIAGNOSIS — I6203 Nontraumatic chronic subdural hemorrhage: Secondary | ICD-10-CM | POA: Diagnosis present

## 2021-04-28 DIAGNOSIS — E209 Hypoparathyroidism, unspecified: Secondary | ICD-10-CM | POA: Diagnosis present

## 2021-04-28 DIAGNOSIS — Z91018 Allergy to other foods: Secondary | ICD-10-CM | POA: Diagnosis not present

## 2021-04-28 DIAGNOSIS — G935 Compression of brain: Secondary | ICD-10-CM | POA: Diagnosis present

## 2021-04-28 DIAGNOSIS — Z8616 Personal history of COVID-19: Secondary | ICD-10-CM | POA: Diagnosis not present

## 2021-04-28 DIAGNOSIS — Z8249 Family history of ischemic heart disease and other diseases of the circulatory system: Secondary | ICD-10-CM | POA: Diagnosis not present

## 2021-04-28 LAB — COMPREHENSIVE METABOLIC PANEL
ALT: 20 U/L (ref 0–44)
AST: 22 U/L (ref 15–41)
Albumin: 3.1 g/dL — ABNORMAL LOW (ref 3.5–5.0)
Alkaline Phosphatase: 118 U/L (ref 38–126)
Anion gap: 6 (ref 5–15)
BUN: 17 mg/dL (ref 8–23)
CO2: 28 mmol/L (ref 22–32)
Calcium: 9.1 mg/dL (ref 8.9–10.3)
Chloride: 104 mmol/L (ref 98–111)
Creatinine, Ser: 0.7 mg/dL (ref 0.44–1.00)
GFR, Estimated: >60 mL/min (ref >60)
Glucose, Bld: 88 mg/dL (ref 70–99)
Potassium: 4.3 mmol/L (ref 3.5–5.1)
Sodium: 138 mmol/L (ref 135–145)
Total Bilirubin: 0.8 mg/dL (ref 0.3–1.2)
Total Protein: 6.7 g/dL (ref 6.5–8.1)

## 2021-04-28 LAB — CBC WITH DIFFERENTIAL/PLATELET
Abs Immature Granulocytes: 0.03 10*3/uL (ref 0.00–0.07)
Basophils Absolute: 0.1 10*3/uL (ref 0.0–0.1)
Basophils Relative: 1 %
Eosinophils Absolute: 0.4 10*3/uL (ref 0.0–0.5)
Eosinophils Relative: 5 %
HCT: 40.9 % (ref 36.0–46.0)
Hemoglobin: 13.3 g/dL (ref 12.0–15.0)
Immature Granulocytes: 0 %
Lymphocytes Relative: 38 %
Lymphs Abs: 3.4 10*3/uL (ref 0.7–4.0)
MCH: 29.7 pg (ref 26.0–34.0)
MCHC: 32.5 g/dL (ref 30.0–36.0)
MCV: 91.3 fL (ref 80.0–100.0)
Monocytes Absolute: 0.9 10*3/uL (ref 0.1–1.0)
Monocytes Relative: 10 %
Neutro Abs: 4.2 10*3/uL (ref 1.7–7.7)
Neutrophils Relative %: 46 %
Platelets: 253 10*3/uL (ref 150–400)
RBC: 4.48 MIL/uL (ref 3.87–5.11)
RDW: 12.7 % (ref 11.5–15.5)
WBC: 9 10*3/uL (ref 4.0–10.5)
nRBC: 0 % (ref 0.0–0.2)

## 2021-04-28 LAB — PROTIME-INR
INR: 1 (ref 0.8–1.2)
Prothrombin Time: 12.7 seconds (ref 11.4–15.2)

## 2021-04-28 LAB — APTT: aPTT: 28 seconds (ref 24–36)

## 2021-04-28 LAB — MAGNESIUM: Magnesium: 2.1 mg/dL (ref 1.7–2.4)

## 2021-04-28 MED ORDER — ONDANSETRON HCL 4 MG/2ML IJ SOLN
4.0000 mg | Freq: Four times a day (QID) | INTRAMUSCULAR | Status: DC | PRN
  Administered 2021-05-01: 4 mg via INTRAVENOUS

## 2021-04-28 MED ORDER — ONDANSETRON HCL 4 MG PO TABS: 4.0000 mg | ORAL_TABLET | Freq: Four times a day (QID) | ORAL | Status: DC | PRN

## 2021-04-28 MED ORDER — LACTATED RINGERS IV SOLN: INTRAVENOUS | Status: DC

## 2021-04-28 MED ORDER — BUTALBITAL-APAP-CAFFEINE 50-325-40 MG PO TABS
1.0000 | ORAL_TABLET | Freq: Four times a day (QID) | ORAL | Status: DC | PRN
  Administered 2021-04-28 – 2021-05-01 (×7): 1 via ORAL
  Filled 2021-04-28 (×7): qty 1

## 2021-04-28 MED ORDER — TRAMADOL HCL 50 MG PO TABS
50.0000 mg | ORAL_TABLET | Freq: Four times a day (QID) | ORAL | Status: DC | PRN
  Administered 2021-04-28 – 2021-05-03 (×15): 50 mg via ORAL
  Filled 2021-04-28 (×15): qty 1

## 2021-04-28 MED ORDER — LEVETIRACETAM 500 MG PO TABS
500.0000 mg | ORAL_TABLET | Freq: Two times a day (BID) | ORAL | Status: DC
  Administered 2021-04-28 – 2021-05-04 (×12): 500 mg via ORAL
  Filled 2021-04-28 (×12): qty 1

## 2021-04-28 MED ORDER — POLYETHYLENE GLYCOL 3350 17 G PO PACK: 17.0000 g | PACK | Freq: Every day | ORAL | Status: DC | PRN

## 2021-04-28 MED ORDER — POLYVINYL ALCOHOL 1.4 % OP SOLN
1.0000 [drp] | Freq: Three times a day (TID) | OPHTHALMIC | Status: DC
  Administered 2021-04-28 – 2021-05-04 (×17): 1 [drp] via OPHTHALMIC
  Filled 2021-04-28 (×2): qty 15

## 2021-04-28 MED ORDER — ACETAMINOPHEN 325 MG PO TABS
650.0000 mg | ORAL_TABLET | ORAL | Status: DC | PRN
  Administered 2021-04-28 – 2021-04-30 (×5): 650 mg via ORAL
  Filled 2021-04-28 (×6): qty 2

## 2021-04-28 MED ORDER — LISINOPRIL 2.5 MG PO TABS
5.0000 mg | ORAL_TABLET | Freq: Every day | ORAL | Status: DC
  Administered 2021-04-28: 5 mg via ORAL
  Filled 2021-04-28: qty 2

## 2021-04-28 NOTE — Progress Notes (Signed)
Received Report from Estate agent at [employer].   [facility] arrived with patient during call and patient is settled into bed. AAO x4 with 5/5 movement in all extremites upon assessment, eyes are 2, round, briskly reactive to light.   Pain: 2/10 with headache. Patient suspects from movement from admission. Would like pain medicine such as tylenol. Patient did not take "migraine cocktail" in ED at [facility].   Swallow screen: Patient swallowed water with no complications. Diet order pending.   Will page/message MD to make him aware of her arrival. Will await orders.

## 2021-04-28 NOTE — Evaluation (Signed)
Physical Therapy Evaluation & Discharge Patient Details Name: Margaret Mclaughlin MRN: [id number] DOB: 1950/09/07 Today's Date: 04/28/2021   History of Present Illness  70 y/o female presented to [facility] ED on 8/25 with headache. 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 PMH: HTN  Clinical Impression  PTA, patient lives alone and reports independence with mobility, drives and manages own  medications. Patient currently functioning at independent level for mobility with no AD. No further skilled PT needs required acutely. No PT follow up recommended at this time.     Follow Up Recommendations No PT follow up    Equipment Recommendations  None recommended by PT    Recommendations for Other Services       Precautions / Restrictions Precautions Precautions: Fall Restrictions Weight Bearing Restrictions: No      Mobility  Bed Mobility Overal bed mobility: Independent                  Transfers Overall transfer level: Independent Equipment used: None                Ambulation/Gait Ambulation/Gait assistance: Independent Gait Distance (Feet): 200 Feet Assistive device: None Gait Pattern/deviations: Drifts right/left   Gait velocity interpretation: >4.37 ft/sec, indicative of normal walking speed General Gait Details: drifts L/R throughout but no LOB  Stairs Stairs: Yes Stairs assistance: Independent Stair Management: No rails;Step to pattern;Forwards Number of Stairs: 1    Wheelchair Mobility    Modified Rankin (Stroke Patients Only) Modified Rankin (Stroke Patients Only) Pre-Morbid Rankin Score: No symptoms Modified Rankin: No symptoms     Balance Overall balance assessment: No apparent balance deficits (not formally assessed)                                           Pertinent Vitals/Pain Pain Assessment: 0-10 Pain Score: 1  Pain Location: head Pain Descriptors / Indicators:  Headache Pain Intervention(s): Monitored during session    Home Living Family/patient expects to be discharged to:: Private residence Living Arrangements: Alone Available Help at Discharge: Family;Available PRN/intermittently Type of Home: House Home Access: Stairs to enter   Entrance Stairs-Number of Steps: 1 Home Layout: One level Home Equipment: Grab bars - tub/shower;[place] - 2 wheels;Cane - single point;Shower seat      Prior Function Level of Independence: Independent         Comments: Lives very active lifestyle. Drives and manages own medications     Hand Dominance        Extremity/Trunk Assessment   Upper Extremity Assessment Upper Extremity Assessment: Overall WFL for tasks assessed    Lower Extremity Assessment Lower Extremity Assessment: Overall WFL for tasks assessed    Cervical / Trunk Assessment Cervical / Trunk Assessment: Normal  Communication   Communication: No difficulties  Cognition Arousal/Alertness: Awake/alert Behavior During Therapy: WFL for tasks assessed/performed Overall Cognitive Status: Within Functional Limits for tasks assessed                                        General Comments      Exercises     Assessment/Plan    PT Assessment Patient needs continued PT services;Patent does not need any further PT services  PT Problem List  PT Treatment Interventions      PT Goals (Current goals can be found in the Care Plan section)  Acute Rehab PT Goals Patient Stated Goal: to go home PT Goal Formulation: All assessment and education complete, DC therapy    Frequency     Barriers to discharge        Co-evaluation               AM-PAC PT "6 Clicks" Mobility  Outcome Measure Help needed turning from your back to your side while in a flat bed without using bedrails?: None Help needed moving from lying on your back to sitting on the side of a flat bed without using bedrails?: None Help  needed moving to and from a bed to a chair (including a wheelchair)?: None Help needed standing up from a chair using your arms (e.g., wheelchair or bedside chair)?: None Help needed to walk in hospital room?: None Help needed climbing 3-5 steps with a railing? : None 6 Click Score: 24    End of Session   Activity Tolerance: Patient tolerated treatment well Patient left: in bed;with call bell/phone within reach;with bed alarm set Nurse Communication: Mobility status PT Visit Diagnosis: Muscle weakness (generalized) (M62.81)    Time: 0017-4944 PT Time Calculation (min) (ACUTE ONLY): 11 min   Charges:   PT Evaluation $PT Eval Low Complexity: 1 Low          Harshaan Whang A. Dan Humphreys PT, DPT Acute Rehabilitation Services Pager [phone] Office [phone]   Viviann Spare 04/28/2021, 9:41 AM

## 2021-04-28 NOTE — ED Notes (Signed)
Called [facility] to give report; RN has number for call back.

## 2021-04-28 NOTE — Consult Note (Addendum)
CC: subdural collection  HPI:     Patient is a 70 y.o. female with HTN, hyperparathyroidism presented to the ER for complaints of progressive headache.  She states she had fallen in her garden 2 weeks ago, but reports no head trauma. She is not on any blood thinners.  She was found to have a subdural hygroma vs chronic hematoma on the right side measuring up to 1 cm in thickness.  She was admitted for further workup.    Patient Active Problem List   Diagnosis Date Noted   Subdural hematoma (HCC) 04/27/2021   COVID-19 virus infection 05/23/2020   Pneumonia due to COVID-19 virus 05/23/2020   Essential hypertension 05/23/2020   Hyperparathyroidism (HCC) 05/23/2020   Hyponatremia 05/23/2020   Past Medical History:  Diagnosis Date   Hypertension     Past Surgical History:  Procedure Laterality Date   ABDOMINAL HYSTERECTOMY     BLADDER SURGERY     CHOLECYSTECTOMY     KNEE SURGERY      Medications Prior to Admission  Medication Sig Dispense Refill Last Dose   acetaminophen (TYLENOL) 500 MG tablet Take 1,000 mg by mouth every 6 (six) hours as needed for fever or headache (pain).   04/27/2021   Ascorbic Acid (VITAMIN C PO) Take 2 tablets by mouth 2 (two) times daily.   04/27/2021   Cholecalciferol (VITAMIN D3 PO) Take 4 drops by mouth daily.   04/27/2021   lisinopril (ZESTRIL) 5 MG tablet Take 5 mg by mouth daily.   04/27/2021   OVER THE COUNTER MEDICATION Take 1 packet by mouth every morning. Gentle Health vitamin pak   04/27/2021   OVER THE COUNTER MEDICATION Take 1 tablet by mouth 2 (two) times daily. "Caramide"   04/27/2021   Polyvinyl Alcohol-Povidone PF (REFRESH) 1.4-0.6 % SOLN Place 1 drop into both eyes 3 (three) times daily.   04/27/2021   Zinc 30 MG TABS Take 30 mg by mouth 2 (two) times daily.   04/27/2021   OVER THE COUNTER MEDICATION Take 1 tablet by mouth 2 (two) times daily. Total Eye Bright      Allergies  Allergen Reactions   Hydrocodone-Acetaminophen Nausea And Vomiting    Oxycodone-Acetaminophen Nausea And Vomiting   Gluten Meal Nausea And Vomiting   Hydrocodone Nausea And Vomiting   Oxycodone Nausea And Vomiting    Social History   Tobacco Use   Smoking status: Never   Smokeless tobacco: Never  Substance Use Topics   Alcohol use: Never    Family History  Problem Relation Age of Onset   Hypertension Other      Review of Systems Review of systems not obtained due to patient factors.  Objective:   Patient Vitals for the past 8 hrs:  BP Temp Temp src Resp SpO2  04/28/21 0401 112/70 98 F (36.7 C) Oral 14 96 %   No intake/output data recorded. No intake/output data recorded.    Patient in MRI so exam deferred.  Data Review:  CT head and MRI reviewed.  Chronic R SDH collection, small focal area of blood clot over a right perirolandic surface cortical vein.  Collection measures 1 cm,  5 mm midline shift  Assessment:   Principal Problem:   Subdural hematoma (HCC) Active Problems:   Essential hypertension   Plan:  - Patient was unfortunately not in her room but I will discuss with patient option of palliative bur hole drainage of her subdural collection if she is having worsening headaches.  Otherwise, we  can have her f/u in clinic in 2 weeks with another CT head without contrast.   Addendum: I went by the patient's room again.  Unfortunately, she was again away for a ultrasound study.  I had a long discussion with her eldest daughter Marcelino Duster.  I explained the patient's diagnosis.  I recommended bur hole drainage if she is having worsening or persistent headache. The general technique of surgery as well as risks, benefits, alternatives, and expected convalescence were discussed.  She will talk with her mother and other sisters to decide if they want to proceed.  Surgery would likely be Monday or Tuesday.

## 2021-04-28 NOTE — TOC Initial Note (Signed)
Transition of Care [facility]) - Initial/Assessment Note    Patient Details  Name: Margaret Mclaughlin MRN: [id number] Date of Birth: [birth year]  [facility]) CM/SW Contact:    Kermit Balo, RN Phone Number: 04/28/2021, 3:58 PM  Clinical Narrative:                 Patient lives at home alone but has daughters that live close that are in and out all the time.  No DME at home.  She denies any issues with her home medications. Patient drives her self locally but daughters provide transport if she has an appt in [place].  No f/u per PT.  CM following for further d/c needs.   Expected Discharge Plan: Home/Self Care Barriers to Discharge: Continued Medical Work up   Patient Goals and CMS Choice        Expected Discharge Plan and Services Expected Discharge Plan: Home/Self Care   Discharge Planning Services: CM Consult   Living arrangements for the past 2 months: Single Family Home                                      Prior Living Arrangements/Services Living arrangements for the past 2 months: Single Family Home Lives with:: Self Patient language and need for interpreter reviewed:: Yes          Care giver support system in place?: No (comment)   Criminal Activity/Legal Involvement Pertinent to Current Situation/Hospitalization: No - Comment as needed  Activities of Daily Living Home Assistive Devices/Equipment: Cane (specify quad or straight) (Does not use; uses walking stick) ADL Screening (condition at time of admission) Patient's cognitive ability adequate to safely complete daily activities?: Yes Is the patient deaf or have difficulty hearing?: No Does the patient have difficulty seeing, even when wearing glasses/contacts?: No Does the patient have difficulty concentrating, remembering, or making decisions?: No Patient able to express need for assistance with ADLs?: Yes Does the patient have difficulty dressing or bathing?: No Independently performs  ADLs?: Yes (appropriate for developmental age) Does the patient have difficulty walking or climbing stairs?: No Weakness of Legs: None Weakness of Arms/Hands: None  Permission Sought/Granted                  Emotional Assessment Appearance:: Appears stated age Attitude/Demeanor/Rapport: Engaged Affect (typically observed): Accepting Orientation: : Oriented to Self, Oriented to Place, Oriented to  Time, Oriented to Situation   Psych Involvement: No (comment)  Admission diagnosis:  Subdural hematoma (HCC) [S06.5X9A] Patient Active Problem List   Diagnosis Date Noted   Subdural hematoma (HCC) 04/27/2021   COVID-19 virus infection 05/23/2020   Pneumonia due to COVID-19 virus 05/23/2020   Essential hypertension 05/23/2020   Hyperparathyroidism (HCC) 05/23/2020   Hyponatremia 05/23/2020   PCP:  Ninfa Meeker, FNP Pharmacy:   [phone] - [place], [place] - [address] [facility] [address] Phone: [phone] Fax: [phone]     Social Determinants of Health (SDOH) Interventions    Readmission Risk Interventions No flowsheet data found.

## 2021-04-28 NOTE — H&P (Signed)
History and Physical    Margaret Mclaughlin:[id number] DOB: 21-Jul-1951 DOA: 04/27/2021  PCP: Ninfa Meeker, FNP  Patient coming from: [facility]   Chief Complaint:  Chief Complaint  Patient presents with   Headache     HPI:    70 year old female with past medical history of hypertension, hyperparathyroidism who presents to med [facility] emergency department with complaints of headache.  Patient explains that slightly over 2 weeks ago she fell in her garden while at home.  Patient immediately began to experience severe low back pain and buttock pain.  Patient attempted to manage this conservatively over the next several days but eventually presented to see her primary care provider who performed x-rays of the lower back and hips that were unremarkable.  Patient continued to experience buttock pain and low back pain and eventually presented to med [facility] emergency department on 8/19 for evaluation.  Patient was evaluated with CT imaging of the pelvis which again was negative for any fracture.    In the days that followed, patient is low back pain and pelvic pain resolved but she noticed that for the past several days she has begun to develop intense headaches.  Patient describes these intense headaches as frontal in location, usually positioned behind the eyes, waxing and waning, throbbing in quality and nonradiating.  Patient denies any photophobia or neck stiffness.  Patient denies any focal weakness slurring of speech or changes in vision.  Patient denies any changes in balance.  Patient's waxing waning headaches continue to worsen and on Wednesday the patient began to develop associated nausea and 1 episode of vomiting.  Patient's discomfort continued to persist and the patient eventually presented to med [facility] emergency department for evaluation of her headaches.  Upon evaluation at [facility] emergency department CT imaging of the head was  performed revealing a right-sided subdural collection measuring 10 mm in the frontal region and 9 mm in the parietal region thought to present possibly subacute or chronic subdural hematoma.  There is additionally a 6 mm right to left midline shift found.  ER provider discussed case with Dr. [name] with neurosurgery who recommended overnight observation in the hospital service with serial neurologic checks and MRI brain upon arrival to [facility] with neurosurgery to formally consult in the morning.  The hospitalist group has now been called to assess the patient for admission to the hospital.  Review of Systems:   Review of Systems  Musculoskeletal:  Positive for back pain.  Neurological:  Positive for headaches.  All other systems reviewed and are negative.  Past Medical History:  Diagnosis Date   Hypertension     Past Surgical History:  Procedure Laterality Date   ABDOMINAL HYSTERECTOMY     BLADDER SURGERY     CHOLECYSTECTOMY     KNEE SURGERY       reports that she has never smoked. She has never used smokeless tobacco. She reports that she does not drink alcohol and does not use drugs.  Allergies  Allergen Reactions   Hydrocodone-Acetaminophen Nausea And Vomiting   Oxycodone-Acetaminophen Nausea And Vomiting   Gluten Meal Nausea And Vomiting   Hydrocodone Nausea And Vomiting   Oxycodone Nausea And Vomiting    Family History  Problem Relation Age of Onset   Hypertension Other      Prior to Admission medications   Medication Sig Start Date End Date Taking? Authorizing Provider  acetaminophen (TYLENOL) 500 MG tablet Take  1,000 mg by mouth every 6 (six) hours as needed for fever or headache (pain).    [provider]  Ascorbic Acid (VITAMIN C PO) Take 1-2 tablets by mouth See admin instructions. Take 2 tablets by mouth with breakfast and lunch, take 1 tablet with supper    [provider]  Cholecalciferol (VITAMIN D3 PO) Take 1 tablet by mouth 2  (two) times daily with breakfast and lunch.    [provider]  ciprofloxacin (CIPRO) 500 MG tablet Take 500 mg by mouth 2 (two) times daily. 05/17/20   [provider]  lisinopril (ZESTRIL) 5 MG tablet Take 5 mg by mouth daily. 05/16/20   [provider]  MAGNESIUM PO Take 1 tablet by mouth 2 (two) times daily with breakfast and lunch.    [provider]  OVER THE COUNTER MEDICATION Take 1 packet by mouth every morning. Gentle Health vitamin pak    [provider]  OVER THE COUNTER MEDICATION Take 1 tablet by mouth 2 (two) times daily. Total Eye Bright    [provider]  OVER THE COUNTER MEDICATION Take 1 tablet by mouth 2 (two) times daily. "Caramide"    [provider]  Polyvinyl Alcohol-Povidone PF (REFRESH) 1.4-0.6 % SOLN Place 1 drop into both eyes 3 (three) times daily.    [provider]  Zinc 30 MG TABS Take 30 mg by mouth 3 (three) times daily with meals.    [provider]    Physical Exam: Vitals:   04/28/21 0000 04/28/21 0015 04/28/21 0100 04/28/21 0401  BP: (!) 153/108 (!) 153/106 (!) 142/94 112/70  Pulse: 78 75 73   Resp: 19 19 20 14   Temp:  98.5 F (36.9 C) 97.7 F (36.5 C) 98 F (36.7 C)  TempSrc:  Oral Oral Oral  SpO2: 97% 97% 100% 96%  Weight:      Height:        Constitutional: Awake alert and oriented x3, no associated distress.   Skin: no rashes, no lesions, good skin turgor noted. Eyes: Pupils are equally reactive to light.  No evidence of scleral icterus or conjunctival pallor.  ENMT: Moist mucous membranes noted.  Posterior pharynx clear of any exudate or lesions.   Neck: normal, supple, no masses, no thyromegaly.  No evidence of jugular venous distension.   Respiratory: clear to auscultation bilaterally, no wheezing, no crackles. Normal respiratory effort. No accessory muscle use.  Cardiovascular: Regular rate and rhythm, no murmurs / rubs / gallops. No extremity edema. 2+ pedal  pulses. No carotid bruits.  Chest:   Nontender without crepitus or deformity.   Back:   Nontender without crepitus or deformity. Abdomen: Abdomen is soft and nontender.  No evidence of intra-abdominal masses.  Positive bowel sounds noted in all quadrants.   Musculoskeletal: No joint deformity upper and lower extremities. Good ROM, no contractures. Normal muscle tone.  Neurologic: CN 2-12 grossly intact. Sensation intact.  Patient moving all 4 extremities spontaneously.  Patient is following all commands.  Patient is responsive to verbal stimuli.   Psychiatric: Patient exhibits normal mood with appropriate affect.  Patient seems to possess insight as to their current situation.     Labs on Admission: I have personally reviewed following labs and imaging studies -   CBC: Recent Labs  Lab 04/27/21 2211  WBC 11.4*  NEUTROABS 6.4  HGB 14.2  HCT 42.7  MCV 90.9  PLT 291   Basic Metabolic Panel: Recent Labs  Lab 04/27/21 2211  NA 137  K 3.6  CL 102  CO2 26  GLUCOSE 103*  BUN 22  CREATININE 0.72  CALCIUM 9.1   GFR: Estimated Creatinine Clearance: 57.2 mL/min (by C-G formula based on SCr of 0.72 mg/dL). Liver Function Tests: Recent Labs  Lab 04/27/21 2211  AST 21  ALT 20  ALKPHOS 139*  BILITOT 0.3  PROT 7.5  ALBUMIN 3.7   No results for input(s): LIPASE, AMYLASE in the last 168 hours. No results for input(s): AMMONIA in the last 168 hours. Coagulation Profile: No results for input(s): INR, PROTIME in the last 168 hours. Cardiac Enzymes: No results for input(s): CKTOTAL, CKMB, CKMBINDEX, TROPONINI in the last 168 hours. BNP (last 3 results) No results for input(s): PROBNP in the last 8760 hours. HbA1C: No results for input(s): HGBA1C in the last 72 hours. CBG: No results for input(s): GLUCAP in the last 168 hours. Lipid Profile: No results for input(s): CHOL, HDL, LDLCALC, TRIG, CHOLHDL, LDLDIRECT in the last 72 hours. Thyroid Function Tests: No results for  input(s): TSH, T4TOTAL, FREET4, T3FREE, THYROIDAB in the last 72 hours. Anemia Panel: No results for input(s): VITAMINB12, FOLATE, FERRITIN, TIBC, IRON, RETICCTPCT in the last 72 hours. Urine analysis: No results found for: COLORURINE, APPEARANCEUR, LABSPEC, PHURINE, GLUCOSEU, HGBUR, BILIRUBINUR, KETONESUR, PROTEINUR, UROBILINOGEN, NITRITE, LEUKOCYTESUR  Radiological Exams on Admission - Personally Reviewed: CT HEAD WO CONTRAST ( )  Result Date: 04/27/2021 CLINICAL DATA:  Headache, intracranial hemorrhage suspected Headache for 2 weeks.  No known injury. Per technologist notes, patient had a fall 2 weeks ago. EXAM: CT HEAD WITHOUT CONTRAST TECHNIQUE: Contiguous axial images were obtained from the base of the skull through the vertex without intravenous contrast. COMPARISON:  Brain MRI 06/12/2018 FINDINGS: Brain: Right-sided holo hemispheric subdural collection measuring 10 mm in the frontal region, series 4, image 48, and 9 mm in the parietal region series 4, image 23. This is primarily isodense to CSF, however a few hyperdense components are seen internally. There is 6 mm right to left midline shift. Possible thin isodense subdural collection in the left parietal lobe, measuring up to 5 mm series 4, image 27. Alternatively this may simply represent prominent CSF. No evidence of intraparenchymal or intraventricular blood. No acute ischemia. No hydrocephalus. Basilar cisterns are patent. Vascular: No hyperdense vessel. Skull: No fracture or focal lesion. Sinuses/Orbits: Paranasal sinuses are clear. Trace opacification of lower left mastoid air cells, chronic. The visualized orbits are unremarkable. Bilateral cataract resection. Other: None. IMPRESSION: 1. Right-sided holo hemispheric subdural collection measuring 10 mm in the frontal region and 9 mm in the parietal region. This is primarily isodense to CSF, however a few hyperdense components are seen internally. This may represent a subacute or chronic  subdural hematoma with residual acute blood products versus subdural hygroma. There is 6 mm right to left midline shift. 2. Possible thin isodense subdural collection in the left parietal lobe, measuring up to 5 mm. Alternatively this may simply represent prominent CSF. These results were called by telephone at the time of interpretation on 04/27/2021 at 10:09 pm to provider DAVID YAO , who verbally acknowledged these results. Electronically Signed   By: Narda Rutherford M.D.   On: 04/27/2021 22:09   CT Cervical Spine Wo Contrast  Result Date: 04/27/2021 CLINICAL DATA:  Neck pain, acute, no red flags EXAM: CT CERVICAL SPINE WITHOUT CONTRAST TECHNIQUE: Multidetector CT imaging of the cervical spine was performed without intravenous contrast. Multiplanar CT image reconstructions were also generated. COMPARISON:  None. FINDINGS: Alignment: Trace anterolisthesis  of C5 on C6 and C6 on C7. Skull base and vertebrae: No acute fracture. Vertebral body heights are maintained. The dens and skull base are intact. Soft tissues and spinal canal: No prevertebral fluid or swelling. No visible canal hematoma. Disc levels: Minor endplate spurring at multiple levels with mild disc space narrowing at C6-C7. Occasional facet hypertrophy. No high-grade canal stenosis. Upper chest: No acute findings. Mild biapical pleuroparenchymal scarring. Other: None. IMPRESSION: Mild degenerative change in the cervical spine without acute fracture or subluxation. Electronically Signed   By: Narda Rutherford M.D.   On: 04/27/2021 22:12    Telemetry:  Personally reviewed, NSR   Assessment/Plan Principal Problem:   Subdural hematoma (HCC)  Patient presenting approximately 2 weeks after suffering a fall at home with progressively worsening headaches CT imaging revealing Right-sided subdural collection measuring 10 mm in the frontal region and 9 mm in the parietal region that may represent a subacute or chronic subdural hematoma with a 6 mm  right to left midline shift ER providers discussed case with Dr. Janee Morn neurosurgery  Serial neurologic checks ordered MRI brain ordered for the morning per neurosurgery recommendations Continuing Keppra 5 mg twice daily x5 days initiated by the emergency department staff Monitoring patient in progressive unit  Active Problems:   Essential hypertension  Continue home regimen of lisinopril   Code Status:  DNR Family Communication: deferred   Status is: Observation  The patient remains OBS appropriate and will d/c before 2 midnights.  Dispo: The patient is from: Home              Anticipated d/c is to: Home              Patient currently is not medically stable to d/c.   Difficult to place patient No        Marinda Elk MD [facility] Pager [phone]  If 7PM-7AM, please contact night-coverage [email or web address] Use universal [place] password for that web site. If you do not have the password, please call the hospital operator.  04/28/2021, 6:28 AM

## 2021-04-28 NOTE — Progress Notes (Signed)
[facility] PLAN OF CARE NOTE Patient: NIMA BAMBURG RCV:[id number]   PCP: Ninfa Meeker, FNP DOB: [birth year]   DOA: 04/27/2021   DOS: 04/28/2021    Patient was admitted by my colleague earlier on 04/28/2021. I have reviewed the H&P as well as assessment and plan and agree with the same. Important changes in the plan are listed below.  Plan of care: Principal Problem:   Subdural hematoma (HCC) Active Problems:   Essential hypertension Appreciate neurosurgery consultation. Discussed with them regarding seizure prophylaxis medication.  Will initiate Keppra 500 mg twice daily.  Author: Lynden Oxford, MD [facility] 04/28/2021 7:17 PM   If 7PM-7AM, please contact night-coverage at [email or web address]

## 2021-04-28 NOTE — Progress Notes (Signed)
Due to worsening severe headache, the patient and family would like to proceed with bur hole drainage of her subdural hematoma.  Risks, benefits, alternatives, and expected convalescence were discussed.  Informed consent was obtained.

## 2021-04-29 LAB — SURGICAL PCR SCREEN
MRSA, PCR: NEGATIVE
Staphylococcus aureus: NEGATIVE

## 2021-04-29 MED ORDER — LISINOPRIL 5 MG PO TABS
5.0000 mg | ORAL_TABLET | Freq: Every day | ORAL | Status: DC
  Administered 2021-04-29 – 2021-05-02 (×4): 5 mg via ORAL
  Filled 2021-04-29: qty 1
  Filled 2021-04-29: qty 2
  Filled 2021-04-29: qty 1
  Filled 2021-04-29: qty 2

## 2021-04-29 NOTE — Progress Notes (Signed)
[facility] Progress Note  Patient: Margaret Mclaughlin    WCB:[id number]  DOA: 04/27/2021     Date of Service: the patient was seen and examined on 04/29/2021  Brief hospital course: Past medical history of HTN, hyperparathyroidism.  Presents with complaints of a headache. Had a fall prior to this presentation.  Found to have SDH Currently plan is controlled symptoms surgery on Monday..  Subjective: Headache improving.  No nausea no vomiting.  No fever no chills.  No chest pain no diarrhea.  Assessment and Plan: 1.  SDH. Headache. Neurosurgery consulted. Recommend bur hole surgery secondary to ongoing headache. Cardiac appears to have improved for now. Will monitor.  Neurologically intact.  Currently scheduled for surgery on Monday  2.  Nausea and vomiting Resolved. Monitor.  3.  Seizure prophylaxis Wound care provided twice daily.  4.  Essential hypertension Continue lisinopril.  Stop IV fluids.  5.  Left adnexal cyst with septation. Patient is scheduled to follow-up with GYN outpatient. Ultrasound pelvis shows evidence of left adnexal cyst measuring 5.2 cm with nodular septation and blood flow.  Scheduled Meds:  levETIRAcetam  500 mg Oral BID   lisinopril  5 mg Oral Daily   polyvinyl alcohol  1 drop Both Eyes TID   Continuous Infusions: PRN Meds: acetaminophen, butalbital-acetaminophen-caffeine, ondansetron **OR** ondansetron (ZOFRAN) IV, polyethylene glycol, traMADol  Body mass index is 29.48 kg/m.        DVT Prophylaxis:   SCDs Start: 04/28/21 6160    Advance goals of care discussion: Pt is DNR.  Family Communication: family was present at bedside, at the time of interview.  The pt provided permission to discuss medical plan with the family. Opportunity was given to ask question and all questions were answered satisfactorily.   Data Reviewed: I have personally reviewed and interpreted daily labs, tele strips, imaging. No significant events on  telemetry.  Physical Exam:  General: Appear in mild distress, no Rash; Oral Mucosa Clear, moist. no Abnormal Neck Mass Or lumps, Conjunctiva normal  Cardiovascular: S1 and S2 Present, no Murmur, Respiratory: good respiratory effort, Bilateral Air entry present and CTA, no Crackles, no wheezes Abdomen: Bowel Sound present, Soft and no tenderness Extremities: no Pedal edema Neurology: alert and oriented to time, place, and person affect appropriate. no new focal deficit Gait not checked due to patient safety concerns  Vitals:   04/28/21 2341 04/29/21 0425 04/29/21 1257 04/29/21 1543  BP: 104/75 128/87 136/87 133/81  Pulse: 69 62  76  Resp: 18 18 20 15   Temp: 98.6 F (37 C) 97.6 F (36.4 C) 98.3 F (36.8 C) 97.9 F (36.6 C)  TempSrc: Oral Oral Oral Oral  SpO2: 93% 95% 96% 95%  Weight:      Height:        Disposition:  Status is: Inpatient  Remains inpatient appropriate because:Ongoing diagnostic testing needed not appropriate for outpatient work up  Dispo: The patient is from: Home              Anticipated d/c is to: Home              Patient currently is not medically stable to d/c.   Difficult to place patient No  Time spent: 35 minutes. I reviewed all nursing notes, pharmacy notes, vitals, pertinent old records. I have discussed plan of care as described above with RN.  Author: , MD [facility] 04/29/2021 7:25 PM  To reach On-call, see care teams to locate the attending and reach out  via [email or web address]. Between 7PM-7AM, please contact night-coverage If you still have difficulty reaching the attending provider, please page the [facility] (Director on Call) for [facility] on amion for assistance.

## 2021-04-30 MED ORDER — CEFAZOLIN SODIUM-DEXTROSE 2-4 GM/100ML-% IV SOLN
2.0000 g | INTRAVENOUS | Status: AC
  Administered 2021-05-01: 2 g via INTRAVENOUS
  Filled 2021-04-30: qty 100

## 2021-04-30 NOTE — Anesthesia Preprocedure Evaluation (Addendum)
Anesthesia Evaluation  Patient identified by MRN, date of birth, ID band Patient awake    Reviewed: Allergy & Precautions, NPO status , Patient's Chart, lab work & pertinent test results  History of Anesthesia Complications Negative for: history of anesthetic complications  Airway Mallampati: II  TM Distance: >3 FB Neck ROM: Full    Dental  (+) Dental Advisory Given   Pulmonary  04/27/2021 SARS coronavirus NEG   breath sounds clear to auscultation       Cardiovascular hypertension, Pt. on medications (-) angina Rhythm:Regular Rate:Normal  '21 ECHO: EF 60-65%, normal LVF, normal RVF, no significant valvular abnormalities   Neuro/Psych  Headaches, SDH    GI/Hepatic negative GI ROS, Neg liver ROS,   Endo/Other  negative endocrine ROS  Renal/GU negative Renal ROS     Musculoskeletal   Abdominal   Peds  Hematology negative hematology ROS (+)   Anesthesia Other Findings   Reproductive/Obstetrics                            Anesthesia Physical Anesthesia Plan  ASA: 3  Anesthesia Plan: General   Post-op Pain Management:    Induction: Intravenous  PONV Risk Score and Plan: 3 and Ondansetron, Dexamethasone and Treatment may vary due to age or medical condition  Airway Management Planned: Oral ETT  Additional Equipment: None  Intra-op Plan:   Post-operative Plan: Extubation in OR  Informed Consent: I have reviewed the patients History and Physical, chart, labs and discussed the procedure including the risks, benefits and alternatives for the proposed anesthesia with the patient or authorized representative who has indicated his/her understanding and acceptance.   Patient has DNR.  Discussed DNR with patient and Suspend DNR.   Dental advisory given  Plan Discussed with:   Anesthesia Plan Comments:        Anesthesia Quick Evaluation

## 2021-04-30 NOTE — Progress Notes (Signed)
[facility] Progress Note  Patient: Margaret Mclaughlin    FUX:[id number]  DOA: 04/27/2021     Date of Service: the patient was seen and examined on 04/30/2021  Brief hospital course: Past medical history of HTN, hyperparathyroidism.  Presents with complaints of a headache. Had a fall prior to this presentation.  Found to have SDH Currently plan is controlled symptoms surgery on Monday..  Subjective: Intermittently headache reoccurring.  No nausea no vomiting.  No fever no chills.  No new focal deficit.  Assessment and Plan: 1.  SDH. Headache. Neurosurgery consulted. Recommend bur hole surgery secondary to ongoing headache. Neurologically intact.  Currently scheduled for surgery on Monday Monitor.  2.  Nausea and vomiting Resolved. Monitor.  3.  Seizure prophylaxis Wound care provided twice daily.  4.  Essential hypertension Continue lisinopril.  Stop IV fluids.  5.  Left adnexal cyst with septation. Patient is scheduled to follow-up with GYN outpatient. Ultrasound pelvis shows evidence of left adnexal cyst measuring 5.2 cm with nodular septation and blood flow.  Scheduled Meds:  levETIRAcetam  500 mg Oral BID   lisinopril  5 mg Oral Daily   polyvinyl alcohol  1 drop Both Eyes TID   Continuous Infusions: PRN Meds: acetaminophen, butalbital-acetaminophen-caffeine, ondansetron **OR** ondansetron (ZOFRAN) IV, polyethylene glycol, traMADol  Body mass index is 29.48 kg/m.        DVT Prophylaxis:   SCDs Start: 04/28/21 0254    Advance goals of care discussion: Pt is DNR.  Family Communication: family was present at bedside, at the time of interview.  The pt provided permission to discuss medical plan with the family. Opportunity was given to ask question and all questions were answered satisfactorily.   Data Reviewed: I have personally reviewed and interpreted daily labs, tele strips, imaging. MRSA PCR negative.  Physical Exam:  General: Appear in mild  distress, no Rash; Oral Mucosa Clear, moist. no Abnormal Neck Mass Or lumps, Conjunctiva normal  Cardiovascular: S1 and S2 Present, no Murmur, Respiratory: good respiratory effort, Bilateral Air entry present and CTA, no Crackles, no wheezes Abdomen: Bowel Sound present, Soft and no tenderness Extremities: no Pedal edema Neurology: alert and oriented to time, place, and person affect appropriate. no new focal deficit Gait not checked due to patient safety concerns   Vitals:   04/30/21 0822 04/30/21 1039 04/30/21 1213 04/30/21 1659  BP: 129/89  (!) 141/93 118/76  Pulse:      Resp: 19 15 19    Temp: 99.1 F (37.3 C)  98.7 F (37.1 C) 98.1 F (36.7 C)  TempSrc: Oral  Oral Oral  SpO2: 95%  94% 95%  Weight:      Height:        Disposition:  Status is: Inpatient  Remains inpatient appropriate because:Ongoing diagnostic testing needed not appropriate for outpatient work up  Dispo: The patient is from: Home              Anticipated d/c is to: Home              Patient currently is not medically stable to d/c.   Difficult to place patient No  Time spent: 35 minutes. I reviewed all nursing notes, pharmacy notes, vitals, pertinent old records. I have discussed plan of care as described above with RN.  Author: , MD [facility] 04/30/2021 6:46 PM  To reach On-call, see care teams to locate the attending and reach out via [email or web address]. Between 7PM-7AM, please contact night-coverage If you still have difficulty  reaching the attending provider, please page the [facility] (Director on Call) for [facility] on amion for assistance.

## 2021-05-01 ENCOUNTER — Inpatient Hospital Stay (HOSPITAL_COMMUNITY): Payer: Medicare Other

## 2021-05-01 ENCOUNTER — Encounter (HOSPITAL_COMMUNITY)
Admission: EM | Disposition: A | Discharge status: Home / Self Care | Payer: Self-pay | Source: Home / Self Care | Attending: Internal Medicine

## 2021-05-01 HISTORY — PX: BURR HOLE: SHX908

## 2021-05-01 LAB — CBC WITH DIFFERENTIAL/PLATELET
Abs Immature Granulocytes: 0.03 10*3/uL (ref 0.00–0.07)
Basophils Absolute: 0.1 10*3/uL (ref 0.0–0.1)
Basophils Relative: 1 %
Eosinophils Absolute: 0.4 10*3/uL (ref 0.0–0.5)
Eosinophils Relative: 4 %
HCT: 39.3 % (ref 36.0–46.0)
Hemoglobin: 12.8 g/dL (ref 12.0–15.0)
Immature Granulocytes: 0 %
Lymphocytes Relative: 33 %
Lymphs Abs: 3.4 10*3/uL (ref 0.7–4.0)
MCH: 29.9 pg (ref 26.0–34.0)
MCHC: 32.6 g/dL (ref 30.0–36.0)
MCV: 91.8 fL (ref 80.0–100.0)
Monocytes Absolute: 1.1 10*3/uL — ABNORMAL HIGH (ref 0.1–1.0)
Monocytes Relative: 11 %
Neutro Abs: 5.2 10*3/uL (ref 1.7–7.7)
Neutrophils Relative %: 51 %
Platelets: 255 10*3/uL (ref 150–400)
RBC: 4.28 MIL/uL (ref 3.87–5.11)
RDW: 13 % (ref 11.5–15.5)
WBC: 10.2 10*3/uL (ref 4.0–10.5)
nRBC: 0 % (ref 0.0–0.2)

## 2021-05-01 LAB — PROTIME-INR
INR: 1 (ref 0.8–1.2)
Prothrombin Time: 12.7 seconds (ref 11.4–15.2)

## 2021-05-01 LAB — MAGNESIUM: Magnesium: 2.2 mg/dL (ref 1.7–2.4)

## 2021-05-01 LAB — TYPE AND SCREEN
ABO/RH(D): O POS
Antibody Screen: NEGATIVE

## 2021-05-01 LAB — MRSA NEXT GEN BY PCR, NASAL: MRSA by PCR Next Gen: NOT DETECTED

## 2021-05-01 LAB — BASIC METABOLIC PANEL
Anion gap: 7 (ref 5–15)
BUN: 20 mg/dL (ref 8–23)
CO2: 28 mmol/L (ref 22–32)
Calcium: 9 mg/dL (ref 8.9–10.3)
Chloride: 101 mmol/L (ref 98–111)
Creatinine, Ser: 0.72 mg/dL (ref 0.44–1.00)
GFR, Estimated: >60 mL/min (ref >60)
Glucose, Bld: 103 mg/dL — ABNORMAL HIGH (ref 70–99)
Potassium: 4.3 mmol/L (ref 3.5–5.1)
Sodium: 136 mmol/L (ref 135–145)

## 2021-05-01 SURGERY — CREATION, CRANIAL BURR HOLE
Anesthesia: General | Laterality: Right

## 2021-05-01 MED ORDER — ROCURONIUM BROMIDE 10 MG/ML (PF) SYRINGE
PREFILLED_SYRINGE | INTRAVENOUS | Status: AC
  Filled 2021-05-01: qty 10

## 2021-05-01 MED ORDER — THROMBIN 20000 UNITS EX SOLR
CUTANEOUS | Status: DC | PRN
  Administered 2021-05-01: 20 mL via TOPICAL

## 2021-05-01 MED ORDER — PHENYLEPHRINE HCL-NACL 20-0.9 MG/250ML-% IV SOLN
INTRAVENOUS | Status: DC | PRN
  Administered 2021-05-01: 30 ug/min via INTRAVENOUS

## 2021-05-01 MED ORDER — LABETALOL HCL 5 MG/ML IV SOLN
10.0000 mg | INTRAVENOUS | Status: DC | PRN
  Administered 2021-05-01 – 2021-05-02 (×7): 10 mg via INTRAVENOUS
  Filled 2021-05-01 (×5): qty 4

## 2021-05-01 MED ORDER — LIDOCAINE-EPINEPHRINE 1 %-1:100000 IJ SOLN
INTRAMUSCULAR | Status: DC | PRN
  Administered 2021-05-01: 6 mL

## 2021-05-01 MED ORDER — THROMBIN 20000 UNITS EX SOLR
CUTANEOUS | Status: AC
  Filled 2021-05-01: qty 20000

## 2021-05-01 MED ORDER — LIDOCAINE-EPINEPHRINE 1 %-1:100000 IJ SOLN
INTRAMUSCULAR | Status: AC
  Filled 2021-05-01: qty 1

## 2021-05-01 MED ORDER — SUGAMMADEX SODIUM 200 MG/2ML IV SOLN
INTRAVENOUS | Status: DC | PRN
  Administered 2021-05-01: 200 mg via INTRAVENOUS

## 2021-05-01 MED ORDER — FENTANYL CITRATE (PF) 100 MCG/2ML IJ SOLN: 25.0000 ug | INTRAMUSCULAR | Status: DC | PRN

## 2021-05-01 MED ORDER — CEFAZOLIN SODIUM-DEXTROSE 1-4 GM/50ML-% IV SOLN
1.0000 g | Freq: Three times a day (TID) | INTRAVENOUS | Status: AC
  Administered 2021-05-01 – 2021-05-02 (×3): 1 g via INTRAVENOUS
  Filled 2021-05-01 (×3): qty 50

## 2021-05-01 MED ORDER — LIDOCAINE 2% (20 MG/ML) 5 ML SYRINGE
INTRAMUSCULAR | Status: DC | PRN
Start: 2021-05-01 — End: 2021-05-01
  Administered 2021-05-01: 20 mg via INTRAVENOUS

## 2021-05-01 MED ORDER — PROPOFOL 10 MG/ML IV BOLUS
INTRAVENOUS | Status: AC
  Filled 2021-05-01: qty 40

## 2021-05-01 MED ORDER — 0.9 % SODIUM CHLORIDE (POUR BTL) OPTIME
TOPICAL | Status: DC | PRN
  Administered 2021-05-01: 2000 mL

## 2021-05-01 MED ORDER — PROPOFOL 10 MG/ML IV BOLUS
INTRAVENOUS | Status: DC | PRN
  Administered 2021-05-01: 80 mg via INTRAVENOUS
  Administered 2021-05-01: 20 mg via INTRAVENOUS

## 2021-05-01 MED ORDER — PHENYLEPHRINE 40 MCG/ML (10ML) SYRINGE FOR IV PUSH (FOR BLOOD PRESSURE SUPPORT)
PREFILLED_SYRINGE | INTRAVENOUS | Status: DC | PRN
  Administered 2021-05-01: 80 ug via INTRAVENOUS

## 2021-05-01 MED ORDER — POLYETHYLENE GLYCOL 3350 17 G PO PACK
17.0000 g | PACK | Freq: Every day | ORAL | Status: DC | PRN
  Administered 2021-05-03: 17 g via ORAL
  Filled 2021-05-01: qty 1

## 2021-05-01 MED ORDER — ACETAMINOPHEN 650 MG RE SUPP: 650.0000 mg | RECTAL | Status: DC | PRN

## 2021-05-01 MED ORDER — ONDANSETRON HCL 4 MG/2ML IJ SOLN
INTRAMUSCULAR | Status: AC
  Filled 2021-05-01: qty 2

## 2021-05-01 MED ORDER — MIDAZOLAM HCL 2 MG/2ML IJ SOLN
INTRAMUSCULAR | Status: AC
  Filled 2021-05-01: qty 2

## 2021-05-01 MED ORDER — ASCORBIC ACID 500 MG PO TABS
250.0000 mg | ORAL_TABLET | Freq: Two times a day (BID) | ORAL | Status: DC
  Administered 2021-05-01 – 2021-05-04 (×6): 250 mg via ORAL
  Filled 2021-05-01 (×6): qty 1

## 2021-05-01 MED ORDER — CHLORHEXIDINE GLUCONATE CLOTH 2 % EX PADS
6.0000 | MEDICATED_PAD | Freq: Every day | CUTANEOUS | Status: DC
  Administered 2021-05-01: 6 via TOPICAL

## 2021-05-01 MED ORDER — FENTANYL CITRATE (PF) 100 MCG/2ML IJ SOLN
INTRAMUSCULAR | Status: DC | PRN
  Administered 2021-05-01: 50 ug via INTRAVENOUS
  Administered 2021-05-01: 100 ug via INTRAVENOUS

## 2021-05-01 MED ORDER — DEXAMETHASONE SODIUM PHOSPHATE 10 MG/ML IJ SOLN
INTRAMUSCULAR | Status: DC | PRN
  Administered 2021-05-01: 10 mg via INTRAVENOUS

## 2021-05-01 MED ORDER — LEVETIRACETAM 500 MG PO TABS: 500.0000 mg | ORAL_TABLET | Freq: Two times a day (BID) | ORAL | Status: DC

## 2021-05-01 MED ORDER — HYDROMORPHONE HCL 1 MG/ML IJ SOLN
0.5000 mg | INTRAMUSCULAR | Status: DC | PRN
  Administered 2021-05-02 – 2021-05-03 (×3): 1 mg via INTRAVENOUS
  Filled 2021-05-01 (×3): qty 1

## 2021-05-01 MED ORDER — ENALAPRILAT 1.25 MG/ML IV SOLN
1.2500 mg | Freq: Four times a day (QID) | INTRAVENOUS | Status: DC | PRN
  Administered 2021-05-01 – 2021-05-02 (×3): 1.25 mg via INTRAVENOUS
  Filled 2021-05-01 (×5): qty 1

## 2021-05-01 MED ORDER — DEXAMETHASONE SODIUM PHOSPHATE 10 MG/ML IJ SOLN
INTRAMUSCULAR | Status: AC
  Filled 2021-05-01: qty 1

## 2021-05-01 MED ORDER — PHENYLEPHRINE 40 MCG/ML (10ML) SYRINGE FOR IV PUSH (FOR BLOOD PRESSURE SUPPORT)
PREFILLED_SYRINGE | INTRAVENOUS | Status: AC
  Filled 2021-05-01: qty 10

## 2021-05-01 MED ORDER — MEPERIDINE HCL 25 MG/ML IJ SOLN: 6.2500 mg | INTRAMUSCULAR | Status: DC | PRN

## 2021-05-01 MED ORDER — MIDAZOLAM HCL 2 MG/2ML IJ SOLN: 0.5000 mg | Freq: Once | INTRAMUSCULAR | Status: DC | PRN

## 2021-05-01 MED ORDER — ONDANSETRON HCL 4 MG/2ML IJ SOLN
4.0000 mg | INTRAMUSCULAR | Status: DC | PRN
Start: 2021-05-01 — End: 2021-05-04
  Administered 2021-05-02 – 2021-05-03 (×3): 4 mg via INTRAVENOUS
  Filled 2021-05-01 (×3): qty 2

## 2021-05-01 MED ORDER — LIDOCAINE 2% (20 MG/ML) 5 ML SYRINGE
INTRAMUSCULAR | Status: AC
  Filled 2021-05-01: qty 5

## 2021-05-01 MED ORDER — ACETAMINOPHEN 325 MG PO TABS
650.0000 mg | ORAL_TABLET | ORAL | Status: DC | PRN
  Administered 2021-05-01 – 2021-05-02 (×2): 650 mg via ORAL
  Filled 2021-05-01 (×2): qty 2

## 2021-05-01 MED ORDER — THROMBIN 5000 UNITS EX SOLR
OROMUCOSAL | Status: DC | PRN
  Administered 2021-05-01: 5 mL via TOPICAL

## 2021-05-01 MED ORDER — DOCUSATE SODIUM 100 MG PO CAPS
100.0000 mg | ORAL_CAPSULE | Freq: Two times a day (BID) | ORAL | Status: DC
  Administered 2021-05-01 – 2021-05-04 (×6): 100 mg via ORAL
  Filled 2021-05-01 (×6): qty 1

## 2021-05-01 MED ORDER — PROMETHAZINE HCL 25 MG/ML IJ SOLN: 6.2500 mg | INTRAMUSCULAR | Status: DC | PRN

## 2021-05-01 MED ORDER — SODIUM CHLORIDE 0.9 % IV SOLN: INTRAVENOUS | Status: DC | PRN

## 2021-05-01 MED ORDER — FENTANYL CITRATE (PF) 250 MCG/5ML IJ SOLN
INTRAMUSCULAR | Status: AC
  Filled 2021-05-01: qty 5

## 2021-05-01 MED ORDER — ONDANSETRON HCL 4 MG PO TABS: 4.0000 mg | ORAL_TABLET | ORAL | Status: DC | PRN

## 2021-05-01 MED ORDER — ROCURONIUM BROMIDE 10 MG/ML (PF) SYRINGE
PREFILLED_SYRINGE | INTRAVENOUS | Status: DC | PRN
  Administered 2021-05-01: 60 mg via INTRAVENOUS

## 2021-05-01 MED ORDER — PROMETHAZINE HCL 25 MG PO TABS
12.5000 mg | ORAL_TABLET | ORAL | Status: DC | PRN
  Administered 2021-05-03: 25 mg via ORAL
  Filled 2021-05-01: qty 1

## 2021-05-01 MED ORDER — FLEET ENEMA 7-19 GM/118ML RE ENEM: 1.0000 | ENEMA | Freq: Once | RECTAL | Status: DC | PRN

## 2021-05-01 MED ORDER — THROMBIN 5000 UNITS EX SOLR
CUTANEOUS | Status: AC
  Filled 2021-05-01: qty 5000

## 2021-05-01 SURGICAL SUPPLY — 78 items
BAG COUNTER SPONGE SURGICOUNT (BAG) ×2 IMPLANT
BENZOIN TINCTURE PRP APPL 2/3 (GAUZE/BANDAGES/DRESSINGS) IMPLANT
BLADE CLIPPER SURG (BLADE) ×2 IMPLANT
BNDG GAUZE ELAST 4 BULKY (GAUZE/BANDAGES/DRESSINGS) IMPLANT
BUR ACORN 9.0 PRECISION (BURR) IMPLANT
BUR SPIRAL ROUTER 2.3 (BUR) ×2 IMPLANT
CANISTER SUCT 3000ML PPV (MISCELLANEOUS) ×2 IMPLANT
CARTRIDGE OIL MAESTRO DRILL (MISCELLANEOUS) ×1 IMPLANT
CLIP VESOCCLUDE MED 6/CT (CLIP) IMPLANT
COVER BURR HOLE 7 (Orthopedic Implant) IMPLANT
COVER BURR HOLE UNIV 10 (Orthopedic Implant) ×2 IMPLANT
DERMABOND ADVANCED (GAUZE/BANDAGES/DRESSINGS) ×1
DERMABOND ADVANCED .7 DNX12 (GAUZE/BANDAGES/DRESSINGS) ×1 IMPLANT
DIFFUSER DRILL AIR PNEUMATIC (MISCELLANEOUS) ×2 IMPLANT
DRAPE MICROSCOPE LEICA (MISCELLANEOUS) IMPLANT
DRAPE NEUROLOGICAL W/INCISE (DRAPES) ×2 IMPLANT
DRAPE SHEET LG 3/4 BI-LAMINATE (DRAPES) ×2 IMPLANT
DRAPE WARM FLUID 44X44 (DRAPES) ×2 IMPLANT
DRSG AQUACEL AG ADV 3.5X 6 (GAUZE/BANDAGES/DRESSINGS) IMPLANT
DRSG MEPILEX BORDER 4X4 (GAUZE/BANDAGES/DRESSINGS) IMPLANT
DRSG XEROFORM 1X8 (GAUZE/BANDAGES/DRESSINGS) IMPLANT
DURAPREP 26ML APPLICATOR (WOUND CARE) ×2 IMPLANT
ELECT COATED BLADE 2.86 ST (ELECTRODE) ×2 IMPLANT
ELECT REM PT RETURN 9FT ADLT (ELECTROSURGICAL) ×2
ELECTRODE REM PT RTRN 9FT ADLT (ELECTROSURGICAL) ×1 IMPLANT
EVACUATOR 1/8 PVC DRAIN (DRAIN) IMPLANT
EVACUATOR SILICONE 100CC (DRAIN) IMPLANT
FORCEPS BIPOLAR SPETZLER 8 1.0 (NEUROSURGERY SUPPLIES) IMPLANT
GAUZE 4X4 16PLY ~~LOC~~+RFID DBL (SPONGE) ×4 IMPLANT
GAUZE SPONGE 4X4 12PLY STRL (GAUZE/BANDAGES/DRESSINGS) IMPLANT
GLOVE SURG LTX SZ7.5 (GLOVE) ×2 IMPLANT
GLOVE SURG UNDER POLY LF SZ7.5 (GLOVE) ×4 IMPLANT
GOWN STRL REUS W/ TWL LRG LVL3 (GOWN DISPOSABLE) ×2 IMPLANT
GOWN STRL REUS W/ TWL XL LVL3 (GOWN DISPOSABLE) IMPLANT
GOWN STRL REUS W/TWL 2XL LVL3 (GOWN DISPOSABLE) IMPLANT
GOWN STRL REUS W/TWL LRG LVL3 (GOWN DISPOSABLE) ×2
GOWN STRL REUS W/TWL XL LVL3 (GOWN DISPOSABLE)
GRAFT DURAGEN MATRIX 2WX2L ×2 IMPLANT
HEMOSTAT POWDER KIT SURGIFOAM (HEMOSTASIS) ×2 IMPLANT
HEMOSTAT SURGICEL 2X14 (HEMOSTASIS) IMPLANT
HOOK RETRACTION 12 ELAST STAY (MISCELLANEOUS) IMPLANT
KIT BASIN OR (CUSTOM PROCEDURE TRAY) ×2 IMPLANT
KIT TURNOVER KIT B (KITS) ×2 IMPLANT
NEEDLE HYPO 22GX1.5 SAFETY (NEEDLE) ×2 IMPLANT
NS IRRIG 1000ML POUR BTL (IV SOLUTION) ×4 IMPLANT
OIL CARTRIDGE MAESTRO DRILL (MISCELLANEOUS) ×2
PACK CRANIOTOMY CUSTOM (CUSTOM PROCEDURE TRAY) ×2 IMPLANT
PATTIES SURGICAL .5 X.5 (GAUZE/BANDAGES/DRESSINGS) IMPLANT
PATTIES SURGICAL .5 X3 (DISPOSABLE) IMPLANT
PATTIES SURGICAL 1X1 (DISPOSABLE) IMPLANT
PERFORATOR CRAN ADLT SML 11X7 (MISCELLANEOUS) IMPLANT
PERFORATOR LRG  14-11MM (BIT) ×1
PERFORATOR LRG 14-11MM (BIT) ×1 IMPLANT
PLATE CRANIAL 12 2H RIGID UNI (Plate) ×4 IMPLANT
SCREW UNIII AXS SD 1.5X4 (Screw) ×20 IMPLANT
SEPRAFILM MEMBRANE 5X6 (MISCELLANEOUS) IMPLANT
SPONGE NEURO XRAY DETECT 1X3 (DISPOSABLE) IMPLANT
SPONGE SURGIFOAM ABS GEL 100 (HEMOSTASIS) ×2 IMPLANT
SPONGE T-LAP 18X18 ~~LOC~~+RFID (SPONGE) IMPLANT
STAPLER VISISTAT 35W (STAPLE) IMPLANT
STOCKINETTE 6  STRL (DRAPES) ×1
STOCKINETTE 6 STRL (DRAPES) ×1 IMPLANT
STRIP CLOSURE SKIN 1/2X4 (GAUZE/BANDAGES/DRESSINGS) IMPLANT
SUT ETHILON 3 0 FSL (SUTURE) IMPLANT
SUT ETHILON 3 0 PS 1 (SUTURE) IMPLANT
SUT MON AB 3-0 SH 27 (SUTURE)
SUT MON AB 3-0 SH27 (SUTURE) IMPLANT
SUT NURALON 4 0 TR CR/8 (SUTURE) ×2 IMPLANT
SUT VIC AB 2-0 CP2 18 (SUTURE) ×4 IMPLANT
SUT VIC AB 3-0 SH 8-18 (SUTURE) IMPLANT
SUT VICRYL RAPIDE 3 0 (SUTURE) ×2 IMPLANT
TAPE PAPER 1X10 WHT MICROPORE (GAUZE/BANDAGES/DRESSINGS) IMPLANT
TAPE PAPER 2X10 WHT MICROPORE (GAUZE/BANDAGES/DRESSINGS) IMPLANT
TOWEL GREEN STERILE (TOWEL DISPOSABLE) ×2 IMPLANT
TOWEL GREEN STERILE FF (TOWEL DISPOSABLE) ×2 IMPLANT
TRAY FOLEY MTR SLVR 16FR STAT (SET/KITS/TRAYS/PACK) IMPLANT
TUBE CONNECTING 20X1/4 (TUBING) ×2 IMPLANT
WATER STERILE IRR 1000ML POUR (IV SOLUTION) ×2 IMPLANT

## 2021-05-01 NOTE — Progress Notes (Signed)
Neurosurgery  Pt seen and examined.  A+O x3, mild left drift.  All questions answered.  Will plan for burr hole drainage.

## 2021-05-01 NOTE — Anesthesia Procedure Notes (Signed)
Procedure Name: Intubation Date/Time: 05/01/2021 7:54 AM Performed by: Genelle Bal, CRNA Pre-anesthesia Checklist: Patient identified, Emergency Drugs available, Suction available and Patient being monitored Patient Re-evaluated:Patient Re-evaluated prior to induction Oxygen Delivery Method: Circle system utilized Preoxygenation: Pre-oxygenation with 100% oxygen Induction Type: IV induction Ventilation: Mask ventilation without difficulty Laryngoscope Size: Mac and 3 Grade View: Grade I Tube type: Oral Tube size: 7.0 mm Number of attempts: 1 Airway Equipment and Method: Stylet and Oral airway Placement Confirmation: ETT inserted through vocal cords under direct vision, positive ETCO2 and breath sounds checked- equal and bilateral Secured at: 22 cm Tube secured with: Tape Dental Injury: Teeth and Oropharynx as per pre-operative assessment  Comments: Placed by Orland Mustard SRNA

## 2021-05-01 NOTE — Progress Notes (Signed)
[facility] PROGRESS NOTE  Patient: Margaret Mclaughlin WTU:[id number]   PCP: Ninfa Meeker, FNP DOB: June 27, 1951   DOA: 04/27/2021   DOS: 05/01/2021    Subjective: Seen after the surgery.  Reports some soreness at the surgical site.  No nausea no vomiting.  Objective:  Vitals:   05/01/21 1830 05/01/21 1900  BP: (!) 136/108 (!) 144/97  Pulse: 78 75  Resp: 18 16  Temp:    SpO2: 96% 96%    S1-S2 present. Clear to auscultation. Bowel sound present.  Assessment and plan: Seen after the surgery. Transferred to ICU.  Care plan per neurosurgery. No significant ongoing acute medical comorbidities.  Author: Lynden Oxford, MD [facility] 05/01/2021 7:51 PM   If 7PM-7AM, please contact night-coverage at [email or web address]

## 2021-05-01 NOTE — Progress Notes (Signed)
Pt transported to the OR, report given to OR, RN, family accompanied her, personal items taken by family.

## 2021-05-01 NOTE — Op Note (Signed)
Procedure(s): Right Side Craniotomy for subdural hematoma Procedure Note  Margaret Mclaughlin female 70 y.o. 05/01/2021  Procedure(s) and Anesthesia Type:    * Right Side Craniotomy for subdural hematoma - General  Surgeon(s) and Role:    [name], [name], MD - Primary   Indications:  This is a 70 yo woman who presented with progressively worsening headaches and was found to have a right-sided subdural hematoma which was mostly chronic with several areas of loculation.  Although the subdural hematoma was not large, there was mass-effect with 5 mm of midline shift.  I had a long discussion with the patient and family regarding treatment options.  Given the worsening headaches, I did recommend a surgery for evacuation of this hematoma.  I had discussed with them bur hole drainage with the possibility of craniotomy.  Risk, benefits, alternatives, and expected convalescence were discussed with them.  Risks discussed included, but were not limited to, bleeding, pain, infection, seizure, scar, stroke, neurologic deficit, recurrence, coma, and death.  Informed consent was obtained.      Surgeon: [name]   Anesthesia: General endotracheal anesthesia   Procedure Detail  Right Side Craniotomy for subdural hematoma  The patient is brought to the operating.  General anesthesia was induced and patient was intubated by the anesthesia service.  After appropriate lines and monitors placed, patient was positioned supine with head turned to the left.  Her scalp was clipped, preprepped with alcohol and prepped and draped in sterile fashion.  A timeout was performed.  Preoperative antibiotics were administered.  1% lidocaine with epinephrine was injected in the skin.  A linear incision was made on the scalp.  The galea and periosteum were incised and self-retaining retractors were placed.  Perforator was used to perform a bur hole.Margaret Mclaughlin  Upon opening the dura, it was clear there was fairly dense  loculations.  As such, the incision was extended and craniotome was used to perform a small craniotomy.  The dura was then opened in a cruciate manner, with middle meningeal branch coagulated.  The dura was thoroughly coagulated.  Membrane was then coagulated and cut open.  There was release of mix of chronic subdural hematoma and hygroma from the subdural space.  There were several areas of dense adherence of membrane to the brain, including over a medium size cortical vein.  This could not be easily dissected so it was left in place.  However the subdural space appeared to be thoroughly cleaned out.  Irrigation was performed and returned with clear fluid.  The brain had fairly good reexpansion with only minimal sinking from the skull.  As such, placement of a subdural drain would incur increased risk with less benefit and I elected not to place one.  Any visible membranes were coagulated.  Meticulous hemostasis was obtained.  A piece of DuraGen was placed over the dural cruciate opening.  The bone flap was then replaced with Stryker cranial plating system.  Skin was closed with 2-0 Vicryl stitches in buried interrupted fashion followed by 3-0 Vicryl repeat and Dermabond.  Patient was then extubated by the anesthesia service.  All counts were correct at the end of surgery.  No complications were noted.    Findings: Dense loculations that were adherent to the brain which required a small craniotomy for better dissection and evacuation.    Estimated Blood Loss:  less than 50 mL         Drains: None  Total IV Fluids: See anesthesia records  Blood Given: None         Specimens: None         Implants: Stryker cranial plating system        Complications:  * No complications entered in OR log *         Disposition: PACU - hemodynamically stable.         Condition: stable

## 2021-05-01 NOTE — Anesthesia Postprocedure Evaluation (Signed)
Anesthesia Post Note  Patient: Margaret Mclaughlin  Procedure(s) Performed: Right Side Craniotomy for subdural hematoma (Right)     Patient location during evaluation: PACU Anesthesia Type: General Level of consciousness: awake and alert, patient cooperative and oriented Pain management: pain level controlled Vital Signs Assessment: post-procedure vital signs reviewed and stable Respiratory status: spontaneous breathing, nonlabored ventilation, respiratory function stable and patient connected to nasal cannula oxygen Cardiovascular status: blood pressure returned to baseline and stable Postop Assessment: no apparent nausea or vomiting Anesthetic complications: no   No notable events documented.  Last Vitals:  Vitals:   05/01/21 1025 05/01/21 1030  BP: (!) 152/89 (!) 146/96  Pulse: 66 66  Resp: 15 14  Temp: 36.5 C   SpO2: 93% 94%    Last Pain:  Vitals:   05/01/21 1025  TempSrc: Oral  PainSc: 0-No pain    LLE Motor Response: Purposeful movement (05/01/21 1100) LLE Sensation: Full sensation (05/01/21 1100) RLE Motor Response: Purposeful movement (05/01/21 1100) RLE Sensation: Full sensation (05/01/21 1100)      Yaneliz Radebaugh,E. Cheikh Bramble

## 2021-05-01 NOTE — Transfer of Care (Signed)
Immediate Anesthesia Transfer of Care Note  Patient: Margaret Mclaughlin  Procedure(s) Performed: Right Side Craniotomy for subdural hematoma (Right)  Patient Location: PACU  Anesthesia Type:General  Level of Consciousness: awake, alert  and oriented  Airway & Oxygen Therapy: Patient Spontanous Breathing and Patient connected to nasal cannula oxygen  Post-op Assessment: Report given to RN and Post -op Vital signs reviewed and stable  Post vital signs: Reviewed and stable  Last Vitals:  Vitals Value Taken Time  BP 171/101   Temp    Pulse 66 05/01/21 0919  Resp 13 05/01/21 0919  SpO2 98 % 05/01/21 0919  Vitals shown include unvalidated device data.  Last Pain:  Vitals:   05/01/21 0312  TempSrc: Oral  PainSc:       Patients Stated Pain Goal: 0 (04/30/21 2100)  Complications: No notable events documented.

## 2021-05-02 ENCOUNTER — Inpatient Hospital Stay (HOSPITAL_COMMUNITY): Payer: Medicare Other

## 2021-05-02 ENCOUNTER — Encounter (HOSPITAL_COMMUNITY): Payer: Self-pay | Admitting: Neurosurgery

## 2021-05-02 MED ORDER — LISINOPRIL 10 MG PO TABS
10.0000 mg | ORAL_TABLET | Freq: Every day | ORAL | Status: DC
  Administered 2021-05-03: 10 mg via ORAL
  Filled 2021-05-02: qty 1

## 2021-05-02 MED ORDER — LISINOPRIL 5 MG PO TABS
5.0000 mg | ORAL_TABLET | Freq: Once | ORAL | Status: AC
  Administered 2021-05-02: 5 mg via ORAL
  Filled 2021-05-02: qty 1

## 2021-05-02 NOTE — Progress Notes (Signed)
Subjective: Patient reports left retro-orbital headache today.  No new neurologic deficits  Objective: Vital signs in last 24 hours: Temp:  [97.8 F (36.6 C)-99.4 F (37.4 C)] 99.1 F (37.3 C) (08/30 0822) Pulse Rate:  [66-82] 66 (08/30 1100) Resp:  [12-34] 17 (08/30 1100) BP: (118-169)/(71-108) 140/82 (08/30 1100) SpO2:  [90 %-96 %] 93 % (08/30 1100)  Intake/Output from previous day: 08/29 0701 - 08/30 0700 In: 1340 [P.O.:540; I.V.:700; IV Piggyback:100] Out: 1180 [Urine:1150; Blood:30] Intake/Output this shift: No intake/output data recorded.  Incision c/d/I Awake, alert, Ox3 EOMI No pronator drift  Lab Results: Recent Labs    05/01/21 0044  WBC 10.2  HGB 12.8  HCT 39.3  PLT 255   BMET Recent Labs    05/01/21 0044  NA 136  K 4.3  CL 101  CO2 28  GLUCOSE 103*  BUN 20  CREATININE 0.72  CALCIUM 9.0    Studies/Results: CT HEAD WO CONTRAST  Result Date: 05/02/2021 CLINICAL DATA:  Subdural hematoma EXAM: CT HEAD WITHOUT CONTRAST TECHNIQUE: Contiguous axial images were obtained from the base of the skull through the vertex without intravenous contrast. COMPARISON:  04/27/2021 FINDINGS: Brain: New postoperative changes of right subdural hematoma evacuation. There is now a hypoattenuating fluid and air-containing subdural collection on the right cerebral convexity. Minimal hyperdensity is present. Maximum depth in the area of fluid is 1 cm (previously 0.9 cm). New postprocedural epidural blood products, fluid, and air along the craniotomy. Thin hypodense subdural collection along the left cerebral convexity is no longer identified. Mass-effect remains present with partial effacement of right lateral ventricle and leftward midline shift now measuring 7 mm (previously 9 mm). There is no hydrocephalus. Gray-white differentiation is preserved. Vascular: No new finding. Skull: Calvarium is unremarkable. Sinuses/Orbits: No acute finding. Other: None. IMPRESSION: New  postoperative changes of right cerebral convexity subdural hematoma evacuation. Residual primarily hypodense collection with air is slightly smaller than the preoperative comparison. Mass effect is decreased with persistent but improved midline shift. Thin hypodense subdural collection along the left cerebral convexity is no longer identified. Electronically Signed   By: Guadlupe Spanish M.D.   On: 05/02/2021 08:18    Assessment/Plan: S/p craniotomy for right subdural hematoma  LOS: 4 days  - CT head without contrast reviewed-- some residual shift noted but improved mass effect and subdural collection, no acute processes identified - transfer to floor - likely d/c tomorrow - keppra x 7 days - mobilize   [name] 05/02/2021, 11:58 AM

## 2021-05-02 NOTE — Progress Notes (Signed)
[facility] Progress Note  Patient: Margaret Mclaughlin    IRC:[id number]  DOA: 04/27/2021     Date of Service: the patient was seen and examined on 05/02/2021  Brief hospital course: Past medical history of HTN, hyperparathyroidism.  Presents with complaints of a headache. Had a fall prior to this presentation.  Found to have SDH Currently plan is controlled blood pressure.  Monitor postop recovery.  Follow-up on neurosurgery recommendation.  Subjective: Still has some headache.  Also intermittent nausea with one episode of vomiting.  No chest pain abdominal pain.  No diarrhea no constipation.  Assessment and Plan: 1.  SDH. Headache. SP craniotomy for right subdural hematoma evacuation Neurosurgery consulted. Recommend bur hole surgery secondary to ongoing headache. Neurologically intact. Underwent evacuation on 8/29 Monday. Appreciate neurosurgery assistance.  Continue Keppra for 7 days.  2.  Nausea and vomiting Resolved. Monitor.  3.  Seizure prophylaxis Wound care provided twice daily.  4.  Essential hypertension Continue lisinopril.  Blood pressure remains elevated therefore we will increase the dose from 5 mg to 10 mg.  5.  Left adnexal cyst with septation. Patient is scheduled to follow-up with GYN outpatient. Ultrasound pelvis shows evidence of left adnexal cyst measuring 5.2 cm with nodular septation and blood flow.  Scheduled Meds:  vitamin C  250 mg Oral BID   Chlorhexidine Gluconate Cloth  6 each Topical Daily   docusate sodium  100 mg Oral BID   levETIRAcetam  500 mg Oral BID   [START ON 05/03/2021] lisinopril  10 mg Oral Daily   polyvinyl alcohol  1 drop Both Eyes TID   Continuous Infusions: PRN Meds: acetaminophen **OR** acetaminophen, enalaprilat, HYDROmorphone (DILAUDID) injection, labetalol, ondansetron **OR** ondansetron (ZOFRAN) IV, polyethylene glycol, promethazine, sodium phosphate, traMADol  Body mass index is 29.48 kg/m.        DVT  Prophylaxis:   SCDs Start: 05/01/21 1022    Advance goals of care discussion: Pt is DNR.  Family Communication: no family was present at bedside, at the time of interview.   Data Reviewed: I have personally reviewed and interpreted daily labs, tele strips, imaging. Continue CT scan per neurosurgery unremarkable.Residual primarily hypodense collection with air is slightly smaller than the preoperative comparison. Mass effect is decreased with persistent but improved midline shift. Thin hypodense subdural collection along the left cerebral convexity is no longer identified.  Physical Exam:  General: Appear in mild distress, no Rash; Oral Mucosa Clear, moist. no Abnormal Neck Mass Or lumps, Conjunctiva normal  Cardiovascular: S1 and S2 Present, no Murmur, Respiratory: good respiratory effort, Bilateral Air entry present and CTA, no Crackles, no wheezes Abdomen: Bowel Sound present, Soft and no tenderness Extremities: no Pedal edema Neurology: alert and oriented to time, place, and person affect appropriate. no new focal deficit Gait not checked due to patient safety concerns   Vitals:   05/02/21 1200 05/02/21 1300 05/02/21 1418 05/02/21 1521  BP:  (!) 149/92 (!) 179/98 134/74  Pulse:  66 71 74  Resp:  12 16 14   Temp: 98.4 F (36.9 C)   97.8 F (36.6 C)  TempSrc:    Oral  SpO2:  94% 96% 98%  Weight:      Height:        Disposition:  Status is: Inpatient  Remains inpatient appropriate because:Ongoing diagnostic testing needed not appropriate for outpatient work up  Dispo: The patient is from: Home              Anticipated d/c is to:  Home              Patient currently is not medically stable to d/c.   Difficult to place patient No  Time spent: 35 minutes. I reviewed all nursing notes, pharmacy notes, vitals, pertinent old records. I have discussed plan of care as described above with RN.  Author: Lynden Oxford, MD [facility] 05/02/2021 6:57 PM  To reach On-call,  see care teams to locate the attending and reach out via [email or web address]. Between 7PM-7AM, please contact night-coverage If you still have difficulty reaching the attending provider, please page the [facility] (Director on Call) for [facility] on amion for assistance.

## 2021-05-02 NOTE — Progress Notes (Signed)
Report given to [facility], RN on 4NP.

## 2021-05-03 LAB — CBC
HCT: 41.1 % (ref 36.0–46.0)
Hemoglobin: 13.6 g/dL (ref 12.0–15.0)
MCH: 30.2 pg (ref 26.0–34.0)
MCHC: 33.1 g/dL (ref 30.0–36.0)
MCV: 91.3 fL (ref 80.0–100.0)
Platelets: 268 10*3/uL (ref 150–400)
RBC: 4.5 MIL/uL (ref 3.87–5.11)
RDW: 12.9 % (ref 11.5–15.5)
WBC: 11.1 10*3/uL — ABNORMAL HIGH (ref 4.0–10.5)
nRBC: 0 % (ref 0.0–0.2)

## 2021-05-03 LAB — BASIC METABOLIC PANEL
Anion gap: 7 (ref 5–15)
BUN: 13 mg/dL (ref 8–23)
CO2: 29 mmol/L (ref 22–32)
Calcium: 9.3 mg/dL (ref 8.9–10.3)
Chloride: 99 mmol/L (ref 98–111)
Creatinine, Ser: 0.66 mg/dL (ref 0.44–1.00)
GFR, Estimated: >60 mL/min (ref >60)
Glucose, Bld: 116 mg/dL — ABNORMAL HIGH (ref 70–99)
Potassium: 4.6 mmol/L (ref 3.5–5.1)
Sodium: 135 mmol/L (ref 135–145)

## 2021-05-03 MED ORDER — DEXAMETHASONE 4 MG PO TABS: 2.0000 mg | ORAL_TABLET | Freq: Three times a day (TID) | ORAL | Status: DC

## 2021-05-03 MED ORDER — DEXAMETHASONE 4 MG PO TABS
4.0000 mg | ORAL_TABLET | Freq: Three times a day (TID) | ORAL | Status: DC
  Administered 2021-05-03 – 2021-05-04 (×3): 4 mg via ORAL
  Filled 2021-05-03 (×3): qty 1

## 2021-05-03 MED ORDER — TRAMADOL HCL 50 MG PO TABS
100.0000 mg | ORAL_TABLET | Freq: Four times a day (QID) | ORAL | Status: DC | PRN
  Administered 2021-05-03 – 2021-05-04 (×2): 100 mg via ORAL
  Filled 2021-05-03 (×3): qty 2

## 2021-05-03 MED ORDER — LISINOPRIL 20 MG PO TABS
10.0000 mg | ORAL_TABLET | Freq: Once | ORAL | Status: AC
  Administered 2021-05-03: 10 mg via ORAL
  Filled 2021-05-03: qty 1

## 2021-05-03 MED ORDER — LISINOPRIL 20 MG PO TABS
20.0000 mg | ORAL_TABLET | Freq: Every day | ORAL | Status: DC
  Administered 2021-05-04: 20 mg via ORAL
  Filled 2021-05-03: qty 1

## 2021-05-03 MED ORDER — DEXAMETHASONE 0.5 MG PO TABS: 1.0000 mg | ORAL_TABLET | Freq: Every day | ORAL | Status: DC

## 2021-05-03 MED ORDER — DEXAMETHASONE 4 MG PO TABS: 2.0000 mg | ORAL_TABLET | Freq: Two times a day (BID) | ORAL | Status: DC

## 2021-05-03 MED ORDER — DEXAMETHASONE 0.5 MG PO TABS: 1.0000 mg | ORAL_TABLET | Freq: Two times a day (BID) | ORAL | Status: DC

## 2021-05-03 MED ORDER — LISINOPRIL 20 MG PO TABS: 20.0000 mg | ORAL_TABLET | Freq: Every day | ORAL | Status: DC

## 2021-05-03 NOTE — Progress Notes (Addendum)
PROGRESS NOTE   Margaret Mclaughlin  VOJ:[id number] DOB: 04-28-1951 DOA: 04/27/2021 PCP: Ninfa Meeker, FNP  Brief Narrative:  70 y/o WF prior HTN, hypoparathyroid, covid recovered HOsp 9/20-9/25/21 for this completed 5 d remdisivir--s/p several falls at home over past month, culminating in severe HA's prom[ting ED visit 04/28/21--found to have R subdural + 10 mm frontal region and 9 mm in parietal region 8/28-Burr hole performed  Hospital-Problem based course  Subdural hematoma status post bur hole 8/28 Defer to neurosurgery Keppra dosing, dexamethasone dosing and outpatient follow-up Increased tramadol to 100 every 6 as needed IV Dilaudid 0.5 to 1 mg every 2 as needed severe pain Uncontrolled blood pressure Uptitrated lisinopril to 20 mg if uncontrolled tomorrow add amlodipine 10 Can use Vasotec injection and labetalol for blood pressure above 140  DVT prophylaxis: scd Code Status: DNAR confirmed Family Communication: Discussed with daughter at the bedside Disposition:  Status is: Inpatient  Remains inpatient appropriate because:Hemodynamically unstable and Unsafe d/c plan  Dispo: The patient is from: Home              Anticipated d/c is to: Home              Patient currently is not medically stable to d/c.   Difficult to place patient No       Consultants:  Neurosurgery  Procedures:  8/28 bur hole placement Dr. Janee Morn  Antimicrobials: None   Subjective: 3-4/10 headache but otherwise okay No chest pain no fever no chills eating some Has been out of bed sparingly  Objective: Vitals:   05/03/21 0118 05/03/21 0259 05/03/21 0755 05/03/21 1211  BP: (!) 139/96 127/84 134/86 (!) 153/83  Pulse: 83 70 75 65  Resp: 19 14 18 14   Temp:  99.6 F (37.6 C) 98.4 F (36.9 C) 98.1 F (36.7 C)  TempSrc:  Oral Oral Oral  SpO2: 94% 97% 97% 91%  Weight:      Height:        Intake/Output Summary (Last 24 hours) at 05/03/2021 1318 Last data filed at 05/03/2021 1211 Gross  per 24 hour  Intake 1270 ml  Output 950 ml  Net 320 ml   Filed Weights   04/27/21 1948 05/01/21 1025  Weight: 70.8 kg 70.8 kg    Examination:  Sleepy otherwise slightly pleasant EOMI NCAT large scar over her right temporoparietal region External ocular movements intact finger-nose-finger intact power 5/5 upper extremities lower extremities Reflexes brisk 3/3 no clonus Can straight leg raise without any deficits Sensory is grossly intact   Data Reviewed: personally reviewed   CBC    Component Value Date/Time   WBC 11.1 (H) 05/03/2021 0425   RBC 4.50 05/03/2021 0425   HGB 13.6 05/03/2021 0425   HCT 41.1 05/03/2021 0425   PLT 268 05/03/2021 0425   MCV 91.3 05/03/2021 0425   MCH 30.2 05/03/2021 0425   MCHC 33.1 05/03/2021 0425   RDW 12.9 05/03/2021 0425   LYMPHSABS 3.4 05/01/2021 0044   MONOABS 1.1 (H) 05/01/2021 0044   EOSABS 0.4 05/01/2021 0044   BASOSABS 0.1 05/01/2021 0044   CMP Latest Ref Rng & Units 05/03/2021 05/01/2021 04/28/2021  Glucose 70 - 99 mg/dL 04/30/2021) 993(Z) 88  BUN 8 - 23 mg/dL 13 20 17   Creatinine 0.44 - 1.00 mg/dL 169(C 7.89  Sodium 135 - 145 mmol/L 135 136 138  Potassium 3.5 - 5.1 mmol/L 4.6 4.3 4.3  Chloride 98 - 111 mmol/L 99 101 104  CO2 22 - 32 mmol/L  29 28 28   Calcium 8.9 - 10.3 mg/dL 9.3 9.0 9.1  Total Protein 6.5 - 8.1 g/dL - - 6.7  Total Bilirubin 0.3 - 1.2 mg/dL - - 0.8  Alkaline Phos 38 - 126 U/L - - 118  AST 15 - 41 U/L - - 22  ALT 0 - 44 U/L - - 20     Radiology Studies: CT HEAD WO CONTRAST  Result Date: 05/02/2021 CLINICAL DATA:  Subdural hematoma EXAM: CT HEAD WITHOUT CONTRAST TECHNIQUE: Contiguous axial images were obtained from the base of the skull through the vertex without intravenous contrast. COMPARISON:  04/27/2021 FINDINGS: Brain: New postoperative changes of right subdural hematoma evacuation. There is now a hypoattenuating fluid and air-containing subdural collection on the right cerebral convexity. Minimal  hyperdensity is present. Maximum depth in the area of fluid is 1 cm (previously 0.9 cm). New postprocedural epidural blood products, fluid, and air along the craniotomy. Thin hypodense subdural collection along the left cerebral convexity is no longer identified. Mass-effect remains present with partial effacement of right lateral ventricle and leftward midline shift now measuring 7 mm (previously 9 mm). There is no hydrocephalus. Gray-white differentiation is preserved. Vascular: No new finding. Skull: Calvarium is unremarkable. Sinuses/Orbits: No acute finding. Other: None. IMPRESSION: New postoperative changes of right cerebral convexity subdural hematoma evacuation. Residual primarily hypodense collection with air is slightly smaller than the preoperative comparison. Mass effect is decreased with persistent but improved midline shift. Thin hypodense subdural collection along the left cerebral convexity is no longer identified. Electronically Signed   By: 04/29/2021 M.D.   On: 05/02/2021 08:18     Scheduled Meds:  vitamin C  250 mg Oral BID   Chlorhexidine Gluconate Cloth  6 each Topical Daily   dexamethasone  4 mg Oral Q8H   Followed by   05/04/2021 ON 05/05/2021] dexamethasone  2 mg Oral Q8H   Followed by   07/05/2021 ON 05/07/2021] dexamethasone  2 mg Oral Q12H   Followed by   07/07/2021 ON 05/09/2021] dexamethasone  1 mg Oral Q12H   Followed by   07/09/2021 ON 05/11/2021] dexamethasone  1 mg Oral Daily   docusate sodium  100 mg Oral BID   levETIRAcetam  500 mg Oral BID   lisinopril  10 mg Oral Daily   polyvinyl alcohol  1 drop Both Eyes TID   Continuous Infusions:   LOS: 5 days   Time spent: 9  34, MD [facility] To contact the attending provider between 7A-7P or the covering provider during after hours 7P-7A, please log into the web site [email or web address] and access using universal [place] password for that web site. If you do not have the password, please call the hospital  operator.  05/03/2021, 1:18 PM

## 2021-05-03 NOTE — Progress Notes (Signed)
Subjective: Patient reports left retroorbital headache improved from yesterday but still present  Objective: Vital signs in last 24 hours: Temp:  [97.8 F (36.6 C)-99.6 F (37.6 C)] 98.4 F (36.9 C) (08/31 0755) Pulse Rate:  [66-83] 75 (08/31 0755) Resp:  [12-19] 18 (08/31 0755) BP: (127-179)/(74-100) 134/86 (08/31 0755) SpO2:  [93 %-98 %] 97 % (08/31 0755)  Intake/Output from previous day: 08/30 0701 - 08/31 0700 In: 500 [P.O.:500] Out: 950 [Urine:950] Intake/Output this shift: Total I/O In: 400 [P.O.:400] Out: -   Awake, alert, Ox3 EOMI No pronator drift Incision c/d  Lab Results: Recent Labs    05/01/21 0044 05/03/21 0425  WBC 10.2 11.1*  HGB 12.8 13.6  HCT 39.3 41.1  PLT 255 268   BMET Recent Labs    05/01/21 0044 05/03/21 0425  NA 136 135  K 4.3 4.6  CL 101 99  CO2 28 29  GLUCOSE 103* 116*  BUN 20 13  CREATININE 0.72 0.66  CALCIUM 9.0 9.3    Studies/Results: CT HEAD WO CONTRAST  Result Date: 05/02/2021 CLINICAL DATA:  Subdural hematoma EXAM: CT HEAD WITHOUT CONTRAST TECHNIQUE: Contiguous axial images were obtained from the base of the skull through the vertex without intravenous contrast. COMPARISON:  04/27/2021 FINDINGS: Brain: New postoperative changes of right subdural hematoma evacuation. There is now a hypoattenuating fluid and air-containing subdural collection on the right cerebral convexity. Minimal hyperdensity is present. Maximum depth in the area of fluid is 1 cm (previously 0.9 cm). New postprocedural epidural blood products, fluid, and air along the craniotomy. Thin hypodense subdural collection along the left cerebral convexity is no longer identified. Mass-effect remains present with partial effacement of right lateral ventricle and leftward midline shift now measuring 7 mm (previously 9 mm). There is no hydrocephalus. Gray-white differentiation is preserved. Vascular: No new finding. Skull: Calvarium is unremarkable. Sinuses/Orbits: No  acute finding. Other: None. IMPRESSION: New postoperative changes of right cerebral convexity subdural hematoma evacuation. Residual primarily hypodense collection with air is slightly smaller than the preoperative comparison. Mass effect is decreased with persistent but improved midline shift. Thin hypodense subdural collection along the left cerebral convexity is no longer identified. Electronically Signed   By: Guadlupe Spanish M.D.   On: 05/02/2021 08:18    Assessment/Plan: 70 yo F s/p small R craniotomy for evacuation of SDH - patient will need to f/u in 2 weeks with a repeat head CT without contrast - if significant recurrence noted, she would likely be a candidate for middle meningeal artery embolization - will start dexamethasone to help with headache - ok to go home when more comfortable, likely tomorrow morning  Margaret Mclaughlin 05/03/2021, 10:38 AM

## 2021-05-03 NOTE — Care Management Important Message (Signed)
Important Message  Patient Details  Name: Margaret Mclaughlin MRN: [id number] Date of Birth: 1951-05-17   Medicare Important Message Given:  Yes     [name] [name] 05/03/2021, 3:11 PM

## 2021-05-04 MED ORDER — DEXAMETHASONE 1 MG PO TABS: 1.0000 mg | ORAL_TABLET | Freq: Every day | ORAL | 0 refills | Status: AC

## 2021-05-04 MED ORDER — DEXAMETHASONE 2 MG PO TABS: 2.0000 mg | ORAL_TABLET | Freq: Two times a day (BID) | ORAL | 0 refills | Status: AC

## 2021-05-04 MED ORDER — TRAMADOL HCL 50 MG PO TABS: 50.0000 mg | ORAL_TABLET | Freq: Four times a day (QID) | ORAL | 0 refills | Status: AC | PRN

## 2021-05-04 MED ORDER — LISINOPRIL 20 MG PO TABS: 20.0000 mg | ORAL_TABLET | Freq: Every day | ORAL | 1 refills | Status: AC

## 2021-05-04 MED ORDER — DEXAMETHASONE 4 MG PO TABS: 4.0000 mg | ORAL_TABLET | Freq: Three times a day (TID) | ORAL | 0 refills | Status: AC

## 2021-05-04 MED ORDER — DEXAMETHASONE 2 MG PO TABS: 2.0000 mg | ORAL_TABLET | Freq: Three times a day (TID) | ORAL | 0 refills | Status: AC

## 2021-05-04 MED ORDER — LEVETIRACETAM 500 MG PO TABS: 500.0000 mg | ORAL_TABLET | Freq: Two times a day (BID) | ORAL | 0 refills | Status: AC

## 2021-05-04 MED ORDER — DEXAMETHASONE 1 MG PO TABS: 1.0000 mg | ORAL_TABLET | Freq: Two times a day (BID) | ORAL | 0 refills | Status: AC

## 2021-05-04 NOTE — Discharge Summary (Signed)
Physician Discharge Summary  Margaret Mclaughlin AXK:[id number] DOB: 1951/02/17 DOA: 04/27/2021  PCP: Ninfa Meeker, FNP  Admit date: 04/27/2021 Discharge date: 05/04/2021  Time spent: 37 minutes  Recommendations for Outpatient Follow-up:  Requires outpatient Chem-12, CBC about 1 week time Follow-up with neurosurgery in the outpatient setting-tapering Decadron ordered as per them in addition to Keppra finish as per their instructions Continue lisinopril--meds may be readjusted back to home regimen somewhat discontinued this hospitalization Screen for sleep apnea in the outpatient setting Pain medication to Tylenol first choice scheduled and can take as needed tramadol 50 to 100 mg as per instructions  Discharge Diagnoses:  MAIN problem for hospitalization   Subdural hemorrhages status post bur hole placement   Please see below for itemized issues addressed in [facility]psital- refer to other progress notes for clarity if needed  Discharge Condition: Improved  Diet recommendation: Heart healthy  Filed Weights   04/27/21 1948 05/01/21 1025  Weight: 70.8 kg 70.8 kg    History of present illness:  70 y/o WF prior HTN, hypoparathyroid, covid recovered HOsp 9/20-9/25/21 for this completed 5 d remdisivir--s/p several falls at home over past month, culminating in severe HA's prom[ting ED visit 04/28/21--found to have R subdural + 10 mm frontal region and 9 mm in parietal region 8/28-Burr hole performed  Hospital Course:  Subdural hematoma status post bur hole 8/28 Placed on limited dose of Keppra Secondary to headaches patient was started on dexamethasone with a taper as documented by neurosurgery Dilaudid made the patient very sleepy so was discontinued We trialed higher dose of tramadol which seemed to work but did make patient sleepy so we advocated for Tylenol around-the-clock and tramadol 50-100 mg as per pain scale as dictated in [facility] I had a discussion with Dr. [name] and we elected not  to use Percocet in addition to tramadol and this will need to be reevaluated in the outpatient setting Uncontrolled blood pressure Uptitrated lisinopril to 20 mg on discharge-it looks like her blood pressure is under better control May need to consider addition of amlodipine in the outpatient setting if still uncontrolled  Procedures: Bur hole placement 8/28 Dr. [name] of neurosurgery  Consultations: Neurosurgery Dr. [name]  Discharge Exam: Vitals:   05/04/21 0259 05/04/21 0725  BP: (!) 147/91 (!) 157/96  Pulse: 65 70  Resp: 13 17  Temp: 98.5 F (36.9 C) 98.1 F (36.7 C)  SpO2: 96% 96%    Subj on day of d/c   Awake coherent a little sleepy but pain is under better control no nausea no vomiting Moving 4 limbs equally no distress  General Exam on discharge  EOMI NCAT no focal deficit CTA B no rales no rhonchi Abdomen soft no rebound no guarding ROM intact with no focal deficits moving all 4 limbs equally external ocular movements intact finger-nose-finger intact Chest is clinically clear no added sound S1-S2 no murmur no rub no gallop Reflexes remain brisk   Discharge Instructions   Discharge Instructions     Diet - low sodium heart healthy   Complete by: As directed    Discharge instructions   Complete by: As directed    Please follow the outpatient instructions carefully Tramadol to be taken if pain is unrelieved by Tylenol as per instructions Continue steroids as per Taper written and complete the steroid dosing Continue lisinopril as directed.   Increase activity slowly   Complete by: As directed    No wound care   Complete by: As directed  Allergies as of 05/04/2021       Reactions   Hydrocodone-acetaminophen Nausea And Vomiting   Oxycodone-acetaminophen Nausea And Vomiting   Gluten Meal Nausea And Vomiting   Hydrocodone Nausea And Vomiting   Oxycodone Nausea And Vomiting        Medication List     TAKE these medications     acetaminophen 500 MG tablet Commonly known as: TYLENOL Take 1,000 mg by mouth every 6 (six) hours as needed for fever or headache (pain).   dexamethasone 4 MG tablet Commonly known as: DECADRON Take 1 tablet (4 mg total) by mouth every 8 (eight) hours for 2 doses.   dexamethasone 2 MG tablet Commonly known as: DECADRON Take 1 tablet (2 mg total) by mouth every 8 (eight) hours for 2 days. Start taking on: May 05, 2021   dexamethasone 2 MG tablet Commonly known as: DECADRON Take 1 tablet (2 mg total) by mouth every 12 (twelve) hours for 2 days. Start taking on: May 07, 2021   dexamethasone 1 MG tablet Commonly known as: DECADRON Take 1 tablet (1 mg total) by mouth every 12 (twelve) hours for 2 days. Start taking on: May 09, 2021   dexamethasone 1 MG tablet Commonly known as: DECADRON Take 1 tablet (1 mg total) by mouth daily for 2 days. Start taking on: May 11, 2021   levETIRAcetam 500 MG tablet Commonly known as: KEPPRA Take 1 tablet (500 mg total) by mouth 2 (two) times daily for 4 days.   lisinopril 20 MG tablet Commonly known as: ZESTRIL Take 1 tablet (20 mg total) by mouth daily. Start taking on: May 05, 2021 What changed:  medication strength how much to take   OVER THE COUNTER MEDICATION Take 1 packet by mouth every morning. Gentle Health vitamin pak   OVER THE COUNTER MEDICATION Take 1 tablet by mouth 2 (two) times daily. Total Eye Bright   OVER THE COUNTER MEDICATION Take 1 tablet by mouth 2 (two) times daily. "Caramide"   Refresh 1.4-0.6 % Soln Generic drug: Polyvinyl Alcohol-Povidone PF Place 1 drop into both eyes 3 (three) times daily.   traMADol 50 MG tablet Commonly known as: ULTRAM Take 1 tablet (50 mg total) by mouth every 6 (six) hours as needed for moderate pain or severe pain (take 50 mg for mild pain 3-5/10, and take 100 mg for severe pain 6-8/10).   VITAMIN C PO Take 2 tablets by mouth 2 (two) times daily.    VITAMIN D3 PO Take 4 drops by mouth daily.   Zinc 30 MG Tabs Take 30 mg by mouth 2 (two) times daily.       Allergies  Allergen Reactions   Hydrocodone-Acetaminophen Nausea And Vomiting   Oxycodone-Acetaminophen Nausea And Vomiting   Gluten Meal Nausea And Vomiting   Hydrocodone Nausea And Vomiting   Oxycodone Nausea And Vomiting      The results of significant diagnostics from this hospitalization (including imaging, microbiology, ancillary and laboratory) are listed below for reference.    Significant Diagnostic Studies: CT HEAD WO CONTRAST  Result Date: 05/02/2021 CLINICAL DATA:  Subdural hematoma EXAM: CT HEAD WITHOUT CONTRAST TECHNIQUE: Contiguous axial images were obtained from the base of the skull through the vertex without intravenous contrast. COMPARISON:  04/27/2021 FINDINGS: Brain: New postoperative changes of right subdural hematoma evacuation. There is now a hypoattenuating fluid and air-containing subdural collection on the right cerebral convexity. Minimal hyperdensity is present. Maximum depth in the area of fluid is 1 cm (previously 0.9  cm). New postprocedural epidural blood products, fluid, and air along the craniotomy. Thin hypodense subdural collection along the left cerebral convexity is no longer identified. Mass-effect remains present with partial effacement of right lateral ventricle and leftward midline shift now measuring 7 mm (previously 9 mm). There is no hydrocephalus. Gray-white differentiation is preserved. Vascular: No new finding. Skull: Calvarium is unremarkable. Sinuses/Orbits: No acute finding. Other: None. IMPRESSION: New postoperative changes of right cerebral convexity subdural hematoma evacuation. Residual primarily hypodense collection with air is slightly smaller than the preoperative comparison. Mass effect is decreased with persistent but improved midline shift. Thin hypodense subdural collection along the left cerebral convexity is no longer  identified. Electronically Signed   By: Guadlupe SpanishPraneil  Patel M.D.   On: 05/02/2021 08:18   CT HEAD WO CONTRAST (5MM)  Result Date: 04/27/2021 CLINICAL DATA:  Headache, intracranial hemorrhage suspected Headache for 2 weeks.  No known injury. Per technologist notes, patient had a fall 2 weeks ago. EXAM: CT HEAD WITHOUT CONTRAST TECHNIQUE: Contiguous axial images were obtained from the base of the skull through the vertex without intravenous contrast. COMPARISON:  Brain MRI 06/12/2018 FINDINGS: Brain: Right-sided holo hemispheric subdural collection measuring 10 mm in the frontal region, series 4, image 48, and 9 mm in the parietal region series 4, image 23. This is primarily isodense to CSF, however a few hyperdense components are seen internally. There is 6 mm right to left midline shift. Possible thin isodense subdural collection in the left parietal lobe, measuring up to 5 mm series 4, image 27. Alternatively this may simply represent prominent CSF. No evidence of intraparenchymal or intraventricular blood. No acute ischemia. No hydrocephalus. Basilar cisterns are patent. Vascular: No hyperdense vessel. Skull: No fracture or focal lesion. Sinuses/Orbits: Paranasal sinuses are clear. Trace opacification of lower left mastoid air cells, chronic. The visualized orbits are unremarkable. Bilateral cataract resection. Other: None. IMPRESSION: 1. Right-sided holo hemispheric subdural collection measuring 10 mm in the frontal region and 9 mm in the parietal region. This is primarily isodense to CSF, however a few hyperdense components are seen internally. This may represent a subacute or chronic subdural hematoma with residual acute blood products versus subdural hygroma. There is 6 mm right to left midline shift. 2. Possible thin isodense subdural collection in the left parietal lobe, measuring up to 5 mm. Alternatively this may simply represent prominent CSF. These results were called by telephone at the time of  interpretation on 04/27/2021 at 10:09 pm to provider DAVID YAO , who verbally acknowledged these results. Electronically Signed   By: Narda RutherfordMelanie  Sanford M.D.   On: 04/27/2021 22:09   CT Cervical Spine Wo Contrast  Result Date: 04/27/2021 CLINICAL DATA:  Neck pain, acute, no red flags EXAM: CT CERVICAL SPINE WITHOUT CONTRAST TECHNIQUE: Multidetector CT imaging of the cervical spine was performed without intravenous contrast. Multiplanar CT image reconstructions were also generated. COMPARISON:  None. FINDINGS: Alignment: Trace anterolisthesis of C5 on C6 and C6 on C7. Skull base and vertebrae: No acute fracture. Vertebral body heights are maintained. The dens and skull base are intact. Soft tissues and spinal canal: No prevertebral fluid or swelling. No visible canal hematoma. Disc levels: Minor endplate spurring at multiple levels with mild disc space narrowing at C6-C7. Occasional facet hypertrophy. No high-grade canal stenosis. Upper chest: No acute findings. Mild biapical pleuroparenchymal scarring. Other: None. IMPRESSION: Mild degenerative change in the cervical spine without acute fracture or subluxation. Electronically Signed   By: Narda RutherfordMelanie  Sanford M.D.   On: 04/27/2021  22:12   CT PELVIS WO CONTRAST  Result Date: 04/21/2021 CLINICAL DATA:  Pelvic trauma, sacral and coccyx pain. EXAM: CT PELVIS WITHOUT CONTRAST TECHNIQUE: Multidetector CT imaging of the pelvis was performed following the standard protocol without intravenous contrast. COMPARISON:  None. FINDINGS: Urinary Tract: Signs of pelvic floor dysfunction with cystocele extending well below the pubococcygeal line. No distal ureteral dilation. Bowel: No acute gastrointestinal process to the extent evaluated on pelvic CT. Vascular/Lymphatic: Scattered atheromatous plaque in the distal aorta and extending into iliac vessels. Insert no pelvic adenopathy Reproductive: 4.0 x 3.6 x 5.1 cm cystic LEFT adnexal lesion, potentially with septation along its  upper margin. Post hysterectomy. RIGHT adnexa unremarkable. Other:  No ascites Musculoskeletal: Post ORIF of LEFT femur with dynamic hip screw and intramedullary rod partially visualized. No signs of acute pelvic fracture. Lumbar spine degenerative changes are incidentally noted, incompletely evaluated with facet arthropathy at L4-5 and L5-S1. IMPRESSION: 1. No acute pelvic fracture. 2. Cystic LEFT adnexal lesion potentially septated and slightly greater than 5 cm. In this postmenopausal female with suggest follow-up pelvic sonogram, given potential septation would suggest short interval follow-up, within 6-8 weeks. 3. Signs of large cystocele.  Correlate with any urinary symptoms. 4. Post ORIF of LEFT femur with dynamic hip screw and intramedullary rod partially visualized. 5. Lumbar spine degenerative changes, incompletely evaluated with facet arthropathy at L4-5 and L5-S1. Aortic Atherosclerosis (ICD10-I70.0). Electronically Signed   By: Donzetta Kohut M.D.   On: 04/21/2021 14:36   MR BRAIN WO CONTRAST  Result Date: 04/28/2021 CLINICAL DATA:  Subdural hematoma EXAM: MRI HEAD WITHOUT CONTRAST TECHNIQUE: Multiplanar, multiecho pulse sequences of the brain and surrounding structures were obtained without intravenous contrast. COMPARISON:  CT head 1 day prior FINDINGS: Brain: There is a right cerebral convexity subdural collection measuring up to 1.2 cm overlying the frontal lobe (6-21). This is not significantly changed in size compared to the prior CT allowing for difference in modality and measurement technique. There are thin septations within the collection. The collection is predominantly T2 hyperintense and mildly T1 hyperintense. There is focal increased T1 hyperintensity overlying the right parietal lobe with associated SWI signal dropout and increased DWI signal suggestive of more acute blood products. Additionally, there is a fluid level within the collection on the right dependently (6-14). The  collection results in effacement of the underlying sulci, partial effacement of the right lateral ventricle, and 6 mm leftward midline shift, similar to the prior study. There is a smaller T2/FLAIR hyperintense subdural collection overlying the left cerebral hemisphere measuring up to 4 mm in thickness (9-10), not significantly changed. There is no significant mass effect associated with this collection. A thin subdural collection is also noted along the falx. There is no evidence of acute infarct. Scattered foci of FLAIR signal abnormality throughout the subcortical and periventricular white matter likely reflects sequela of chronic white matter microangiopathy. No mass lesion is seen. Vascular: Normal flow voids. Skull and upper cervical spine: Normal marrow signal. Sinuses/Orbits: Bilateral lens implants are in place. The globes and orbits are otherwise unremarkable. There is mucosal thickening in the left maxillary sinus. The paranasal sinuses are otherwise clear. Other: There is a small left mastoid effusion. IMPRESSION: 1. 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 2. Unchanged mass effect associated with right-sided collection resulting in 6 mm of leftward midline shift. 3. 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 Electronically Signed   By: Lesia Hausen M.D.   On: 04/28/2021 08:24   US PELVIC COMPLETE WITH TRANSVAGINAL  Result Date: 04/28/2021 CLINICAL DATA:  Adnexal cyst EXAM: TRANSABDOMINAL AND TRANSVAGINAL ULTRASOUND OF PELVIS TECHNIQUE: Both transabdominal and transvaginal ultrasound examinations of the pelvis were  performed. Transabdominal technique was performed for global imaging of the pelvis including uterus, ovaries, adnexal regions, and pelvic cul-de-sac. It was necessary to proceed with endovaginal exam following the transabdominal exam to visualize the adnexa. COMPARISON:  None FINDINGS: Uterus Surgically absent. Right ovary Not visualized. Left ovary Measurements: 5.2 x 3.9 x 4.8 cm = volume: 50 mL. Left adnexal cyst measuring 4.4 x 3.9 x 3.3 cm with multiple nodular septations. One of the nodules demonstrates blood flow on color doppler imaging. Other findings No abnormal free fluid. IMPRESSION: Left adnexal cyst measuring up to 5.2 cm with nodular septations, some of which demonstrate blood flow. Consider surgical evaluation. This recommendation follows the consensus statement: Management of Asymptomatic Ovarian and Other Adnexal Cysts Imaged at [place]: Society of Radiologists in Ultrasound Consensus Conference Statement. Radiology 2010; [phone]. Electronically Signed   By: Allegra Lai M.D.   On: 04/28/2021 11:42    Microbiology: Recent Results (from the past 240 hour(s))  Resp Panel by RT-PCR (Flu A&B, Covid) Nasopharyngeal Swab     Status: None   Collection Time: 04/27/21 10:11 PM   Specimen: Nasopharyngeal Swab; Nasopharyngeal(NP) swabs in vial transport medium  Result Value Ref Range Status   SARS Coronavirus 2 by RT PCR NEGATIVE NEGATIVE Final    Comment: (NOTE) SARS-CoV-2 target nucleic acids are NOT DETECTED.  The SARS-CoV-2 RNA is generally detectable in upper respiratory specimens during the acute phase of infection. The lowest concentration of SARS-CoV-2 viral copies this assay can detect is 138 copies/mL. A negative result does not preclude SARS-Cov-2 infection and should not be used as the sole basis for treatment or other patient management decisions. A negative result may occur with  improper specimen collection/handling, submission of specimen other than nasopharyngeal swab,  presence of viral mutation(s) within the areas targeted by this assay, and inadequate number of viral copies(<138 copies/mL). A negative result must be combined with clinical observations, patient history, and epidemiological information. The expected result is Negative.  Fact Sheet for Patients:  BloggerCourse.com  Fact Sheet for Healthcare Providers:  SeriousBroker.it  This test is no t yet approved or cleared by the [place] FDA and  has been authorized for detection and/or diagnosis of SARS-CoV-2 by FDA under an Emergency Use Authorization (EUA). This EUA will remain  in effect (meaning this test can be used) for the duration of the COVID-19 declaration under Section 564(b)(1) of the Act, 21 U.S.C.section 360bbb-3(b)(1), unless the authorization is terminated  or revoked sooner.       Influenza A by PCR NEGATIVE NEGATIVE Final   Influenza B by PCR NEGATIVE NEGATIVE Final    Comment: (NOTE) The Xpert Xpress SARS-CoV-2/FLU/RSV plus assay is intended as an aid in the diagnosis of influenza from Nasopharyngeal swab specimens and should not be used as a sole basis for treatment. Nasal washings and aspirates are unacceptable for Xpert Xpress SARS-CoV-2/FLU/RSV testing.  Fact Sheet for Patients: BloggerCourse.com  Fact Sheet for Healthcare Providers: SeriousBroker.it  This test is not yet approved or cleared by the  [place] [employer] trader and has been authorized for detection and/or diagnosis of SARS-CoV-2 by FDA under an TEFL teacher (EUA). This EUA will remain in effect (meaning this test can be used) for the duration of the COVID-19 declaration under Section 564(b)(1) of the Act, 21 U.S.C. section 360bbb-3(b)(1), unless the authorization is terminated or revoked.  Performed at [facility], [address] [place], [place] [address]   Surgical pcr  screen     Status: None   Collection Time: 04/29/21  8:09 PM   Specimen: Nasal Mucosa; Nasal Swab  Result Value Ref Range Status   MRSA, PCR NEGATIVE NEGATIVE Final   Staphylococcus aureus NEGATIVE NEGATIVE Final    Comment: (NOTE) The Xpert SA Assay (FDA approved for NASAL specimens in patients 69 years of age and older), is one component of a comprehensive surveillance program. It is not intended to diagnose infection nor to guide or monitor treatment. Performed at [facility], [address], [place], [place] [address]   MRSA Next Gen by PCR, Nasal     Status: None   Collection Time: 05/01/21 10:15 AM   Specimen: Nasal Mucosa; Nasal Swab  Result Value Ref Range Status   MRSA by PCR Next Gen NOT DETECTED NOT DETECTED Final    Comment: (NOTE) The GeneXpert MRSA Assay (FDA approved for NASAL specimens only), is one component of a comprehensive MRSA colonization surveillance program. It is not intended to diagnose MRSA infection nor to guide or monitor treatment for MRSA infections. Test performance is not FDA approved in patients less than 82 years old. Performed at [facility], [address] [place], [place] [address]      Labs: Basic Metabolic Panel: Recent Labs  Lab 04/27/21 2211 04/28/21 0650 05/01/21 0044 05/03/21 0425  NA 137 138 136 135  K 3.6 4.3 4.3 4.6  CL 102 104 101 99  CO2 26 28 28 29   GLUCOSE 103* 88 103* 116*  BUN 22 17 20 13   CREATININE 0.72 0.70 0.72 0.66  CALCIUM 9.1 9.1 9.0 9.3  MG  --  2.1 2.2  --    Liver Function Tests: Recent Labs  Lab 04/27/21 2211 04/28/21 0650  AST 21 22  ALT 20 20  ALKPHOS 139* 118  BILITOT 0.3 0.8  PROT 7.5 6.7  ALBUMIN 3.7 3.1*   No results for input(s): LIPASE, AMYLASE in the last 168 hours. No results for input(s): AMMONIA in the last 168 hours. CBC: Recent Labs  Lab 04/27/21 2211 04/28/21 0650 05/01/21 0044 05/03/21 0425  WBC 11.4* 9.0 10.2 11.1*  NEUTROABS 6.4 4.2 5.2  --   HGB  14.2 13.3 12.8 13.6  HCT 42.7 40.9 39.3 41.1  MCV 90.9 91.3 91.8 91.3  PLT 291 253 255 268   Cardiac Enzymes: No results for input(s): CKTOTAL, CKMB, CKMBINDEX, TROPONINI in the last 168 hours. BNP: BNP (last 3 results) Recent Labs    05/25/20 0553 05/26/20 0214 05/28/20 0416  BNP 218.1* 208.2* 144.7*    ProBNP (last 3 results) No results for input(s): PROBNP in the last 8760 hours.  CBG: No results for input(s): GLUCAP in the last 168 hours.     Signed:  05/28/20 MD   [facility] 05/04/2021, 9:21 AM

## 2021-05-04 NOTE — Progress Notes (Signed)
Ivs removed, discharge instructions reviewed with patient and patient's daughter.  Written prescription given to pt for Tramadol.  Pt to D/C home with daughter, will transport via wheelchair.

## 2021-05-04 NOTE — Progress Notes (Signed)
Subjective: Patient reports HA improved  Objective: Vital signs in last 24 hours: Temp:  [98.1 F (36.7 C)-99.3 F (37.4 C)] 98.1 F (36.7 C) (09/01 0725) Pulse Rate:  [63-74] 70 (09/01 0725) Resp:  [13-21] 17 (09/01 0725) BP: (129-157)/(77-96) 157/96 (09/01 0725) SpO2:  [90 %-97 %] 96 % (09/01 0725)  Intake/Output from previous day: 08/31 0701 - 09/01 0700 In: 770 [P.O.:770] Out: 900 [Urine:900] Intake/Output this shift: No intake/output data recorded.  Drowsy, but Ox 3 FC x 4, no drift Incision c/d  Lab Results: Recent Labs    05/03/21 0425  WBC 11.1*  HGB 13.6  HCT 41.1  PLT 268   BMET Recent Labs    05/03/21 0425  NA 135  K 4.6  CL 99  CO2 29  GLUCOSE 116*  BUN 13  CREATININE 0.66  CALCIUM 9.3    Studies/Results: No results found.  Assessment/Plan: S/p crani for R SDH - ok for discharge home - cont dex taper - can do tramadol PRN, with Percocet 5/325 PRN severe pain as well - Keppra x 4 days - discharge instructions given   Margaret Mclaughlin 05/04/2021, 8:49 AM

## 2021-05-15 ENCOUNTER — Other Ambulatory Visit: Payer: Self-pay | Admitting: Neurosurgery

## 2021-05-15 DIAGNOSIS — S065X9A Traumatic subdural hemorrhage with loss of consciousness of unspecified duration, initial encounter: Secondary | ICD-10-CM

## 2021-05-15 DIAGNOSIS — S065XAA Traumatic subdural hemorrhage with loss of consciousness status unknown, initial encounter: Secondary | ICD-10-CM

## 2021-05-22 ENCOUNTER — Other Ambulatory Visit: Payer: Medicare Other

## 2021-05-22 ENCOUNTER — Ambulatory Visit
Admission: RE | Admit: 2021-05-22 | Discharge: 2021-05-22 | Disposition: A | Discharge status: Home / Self Care | Payer: Medicare Other | Source: Ambulatory Visit | Attending: Neurosurgery | Admitting: Neurosurgery

## 2021-05-22 ENCOUNTER — Other Ambulatory Visit: Payer: Self-pay

## 2021-05-22 DIAGNOSIS — S065X9A Traumatic subdural hemorrhage with loss of consciousness of unspecified duration, initial encounter: Secondary | ICD-10-CM

## 2021-05-22 DIAGNOSIS — S065XAA Traumatic subdural hemorrhage with loss of consciousness status unknown, initial encounter: Secondary | ICD-10-CM

## 2021-05-25 ENCOUNTER — Other Ambulatory Visit: Payer: Self-pay | Admitting: Neurosurgery

## 2021-05-25 DIAGNOSIS — S065XAA Traumatic subdural hemorrhage with loss of consciousness status unknown, initial encounter: Secondary | ICD-10-CM

## 2021-05-25 DIAGNOSIS — S065X9A Traumatic subdural hemorrhage with loss of consciousness of unspecified duration, initial encounter: Secondary | ICD-10-CM

## 2021-07-05 ENCOUNTER — Ambulatory Visit
Admission: RE | Admit: 2021-07-05 | Discharge: 2021-07-05 | Disposition: A | Discharge status: Home / Self Care | Payer: Medicare Other | Source: Ambulatory Visit | Attending: Neurosurgery | Admitting: Neurosurgery

## 2021-07-05 DIAGNOSIS — S065XAA Traumatic subdural hemorrhage with loss of consciousness status unknown, initial encounter: Secondary | ICD-10-CM

## 2022-03-07 IMAGING — CT CT HEAD W/O CM
1 series · 14 of 30 positions shown, 18 images · non-contrast
Comparison: Brain MRI 04/28/2021. Prior head CT examinations
05/02/2021 and earlier.

CLINICAL DATA: Subdural hematoma. Additional history provided by
scanning technologist: Subdural hematoma follow-up.

EXAM:
CT HEAD WITHOUT CONTRAST
TECHNIQUE: Contiguous axial images were obtained from the base of the skull
through the vertex without intravenous contrast.

[Series 2: head w/(date) · axial · 0.41mm/px · z∈[-146,-10]mm · 14 of 32 slices shown, 18 images]
[im 3/32  brain]
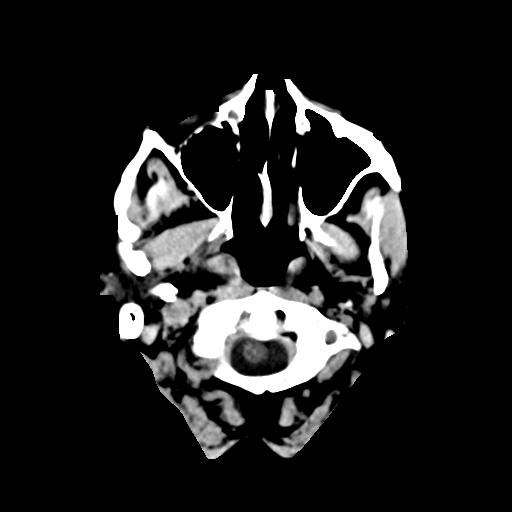
[im 3/32  bone]
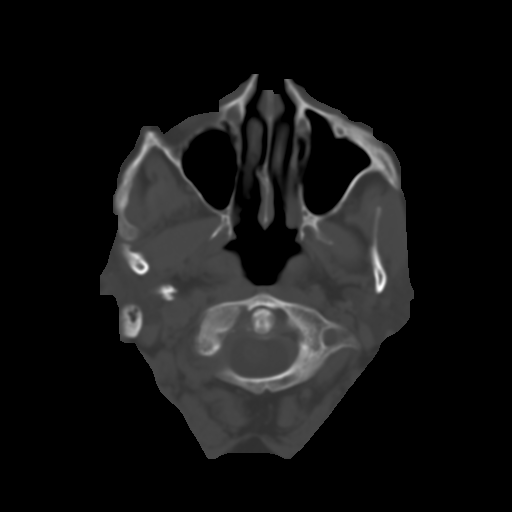
[im 5/32  brain]
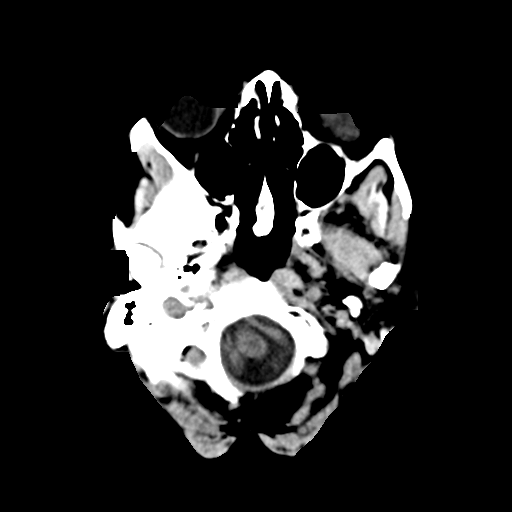
[im 7/32  brain]
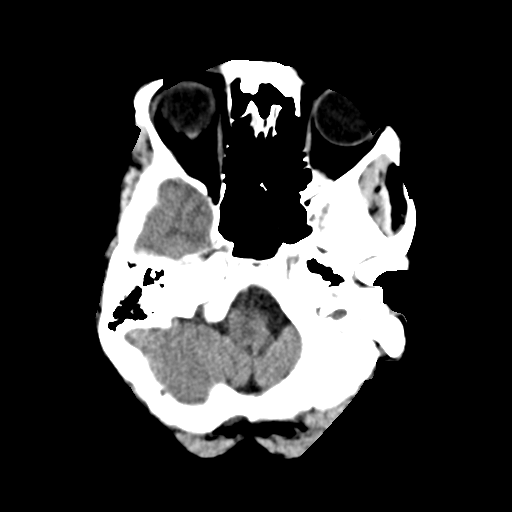
[im 9/32  brain]
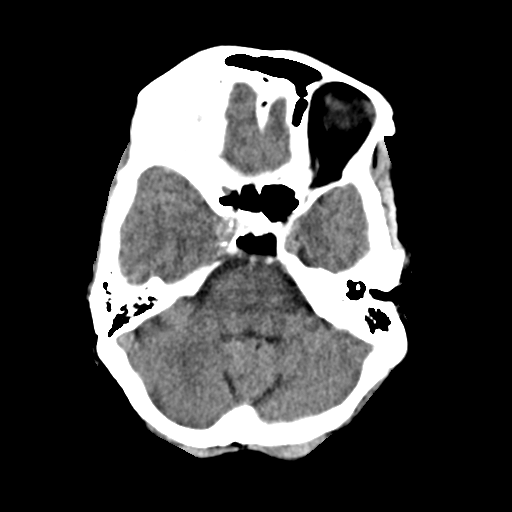
[im 11/32  brain]
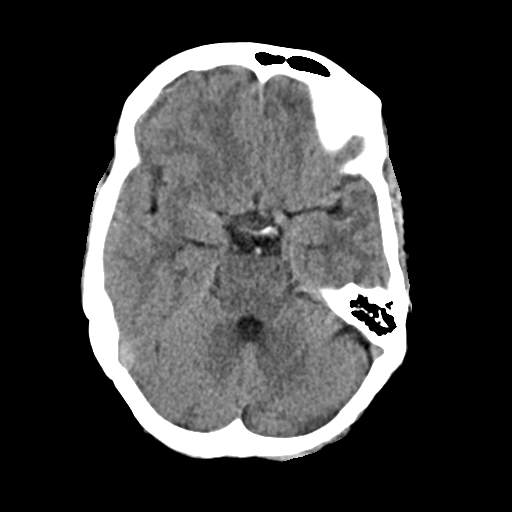
[im 11/32  bone]
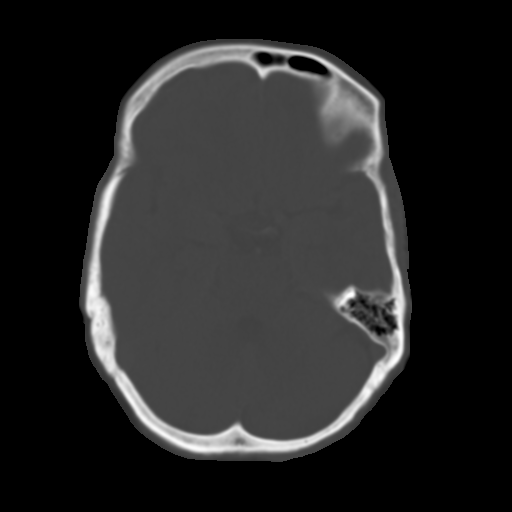
[im 13/32  brain]
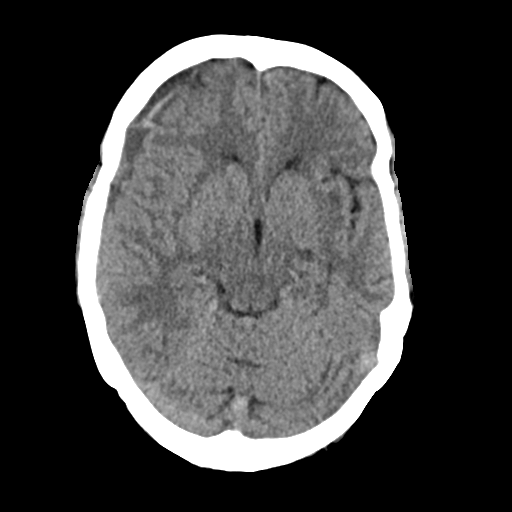
[im 15/32  brain]
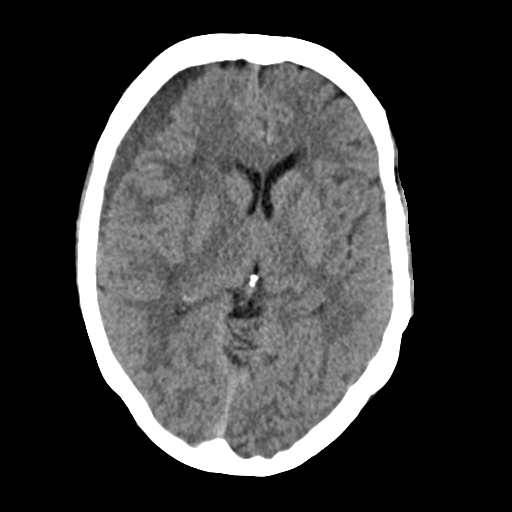
[im 18/32  brain]
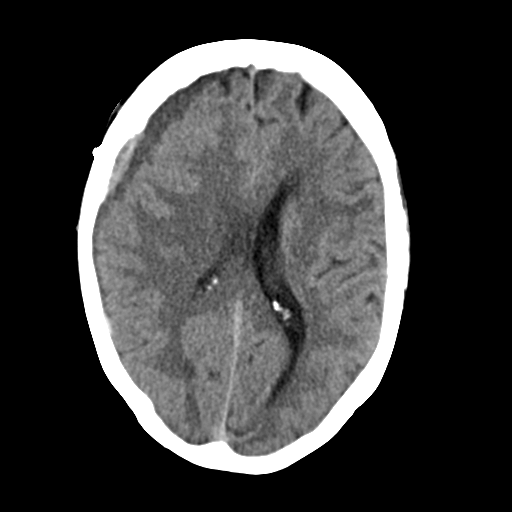
[im 20/32  brain]
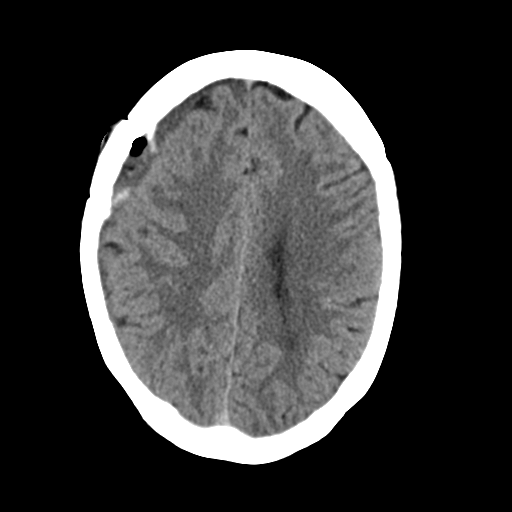
[im 20/32  bone]
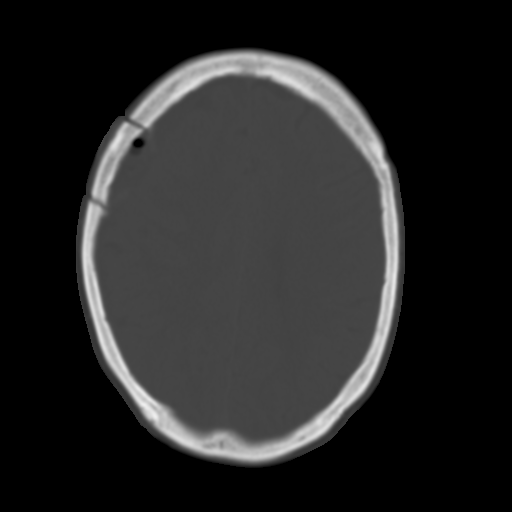
[im 22/32  brain]
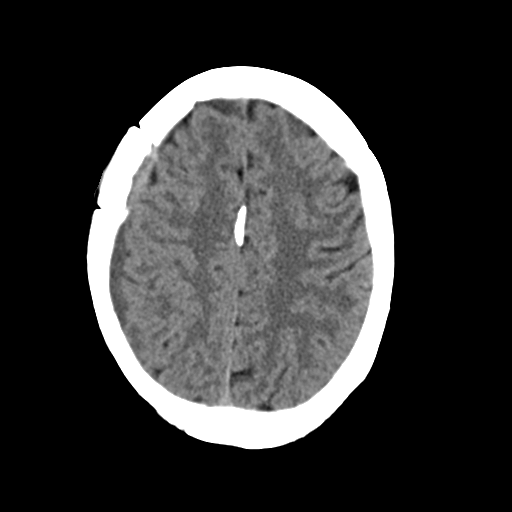
[im 24/32  brain]
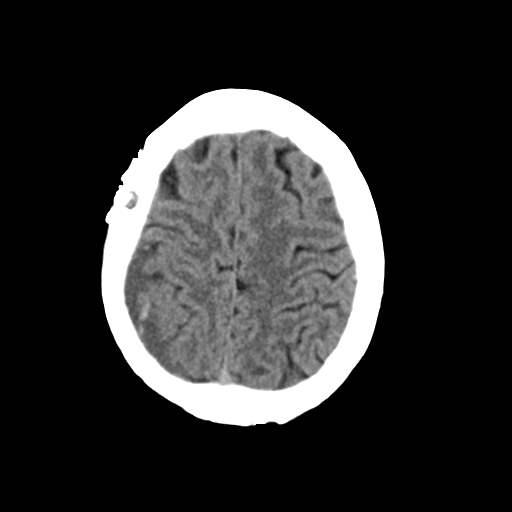
[im 26/32  brain]
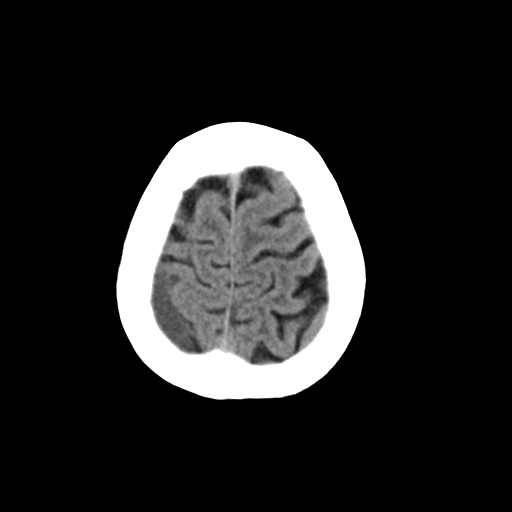
[im 28/32  brain]
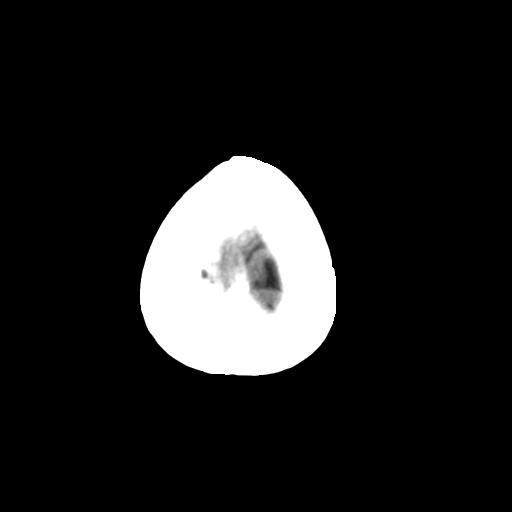
[im 28/32  bone]
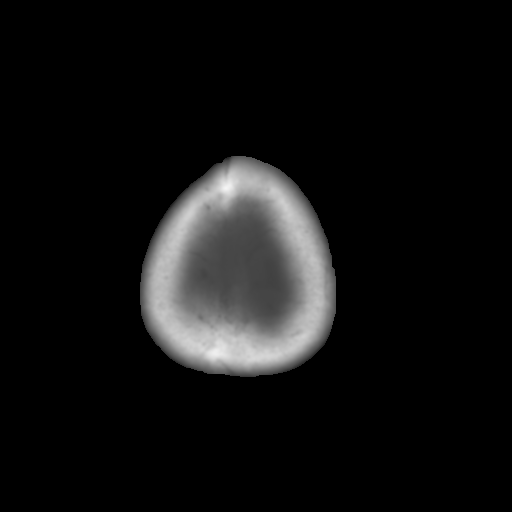
[im 30/32  brain]
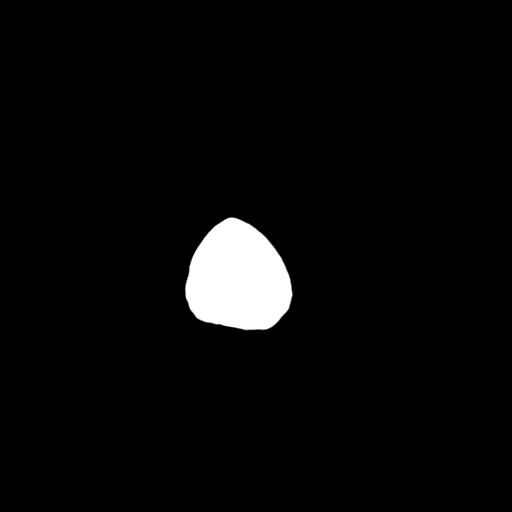

[14 of 30 positions shown; findings below may reference images not displayed]

FINDINGS: Brain:

Interval evolution of postoperative changes from recent prior right
subdural hematoma evacuation. Persistent subdural collection
overlying the right cerebral hemisphere, not significantly changed
in size, again measuring up to 10 mm in greatest thickness
(remeasured on prior). As before, the majority of the collection
demonstrates intermediate density relative to CSF. However, there
are trace residual /recurrent hyperdense blood products scattered
within the collection. Pneumocephalus previously present within the
collection has resolved. There is persistent mass effect upon the
underlying right cerebral hemisphere. Midline shift measured at the
level of the septum pellucidum now measures 6 mm (previously 7 mm).

Persistent small-volume intermediate density fluid and
pneumocephalus within the epidural space, deep to the right-sided
cranioplasty.

Gray-white differentiation is preserved.

No evidence of an intracranial mass.

No hydrocephalus.

Vascular: No hyperdense vessel.  Atherosclerotic calcifications.

Skull: Right temporoparietal cranioplasty. The calvarium is
otherwise unremarkable.

Sinuses/Orbits: Visualized orbits show no acute finding. Trace
mucosal thickening and small mucous retention cyst within the left
maxillary sinus at the imaged levels.
IMPRESSION: Interval evolution of postoperative changes from recent prior right
subdural hematoma evacuation. A persistent subdural collection
overlying the right cerebral hemisphere has not significantly
changed in size from the head CT of 05/02/2021, again measuring up
to 10 mm in greatest thickness. As before, the majority of the
collection demonstrates intermediate density relative to CSF.
However, trace scattered residual/recurrent hyperdense blood
products are present within the collection. Persistent, although
slightly decreased, mass effect with leftward midline shift now
measuring 6 mm (previously 7 mm).

Persistent small-volume intermediate density fluid and
pneumocephalus within the epidural space, deep to the right-sided
cranioplasty.

## 2022-04-20 IMAGING — CT CT HEAD W/O CM
4 series · 16 of 47 positions shown, 18 images · non-contrast
Comparison: 05/22/2021

CLINICAL DATA: Subdural hematoma with craniotomy, follow-up

EXAM:
CT HEAD WITHOUT CONTRAST
TECHNIQUE: Contiguous axial images were obtained from the base of the skull
through the vertex without intravenous contrast.

[Series 2: head 5.00 hr40 s3 axial ibhc · axial · 0.41mm/px · z∈[-618,-498]mm · 7 of 33 slices shown, 9 images]
[im 5/33  brain]
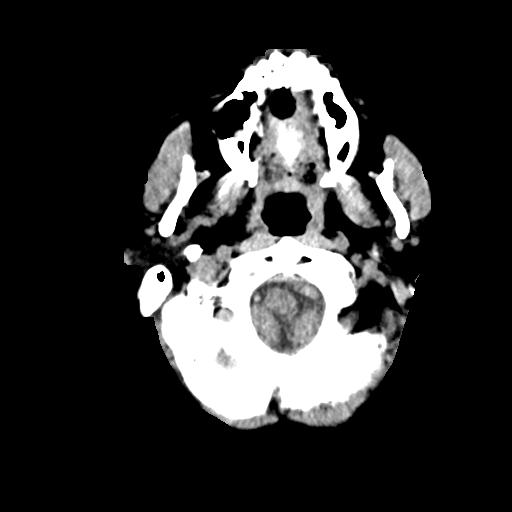
[im 5/33  bone]
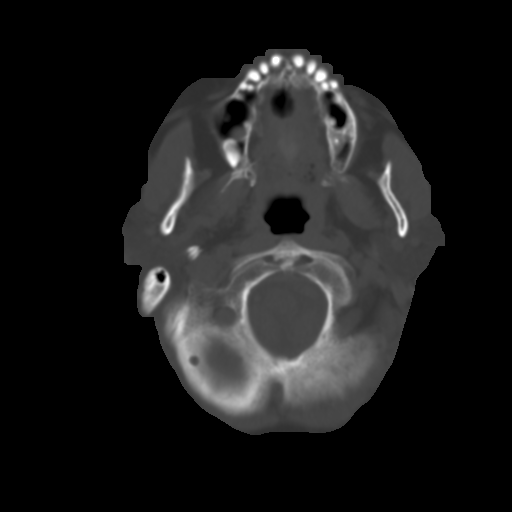
[im 9/33  brain]
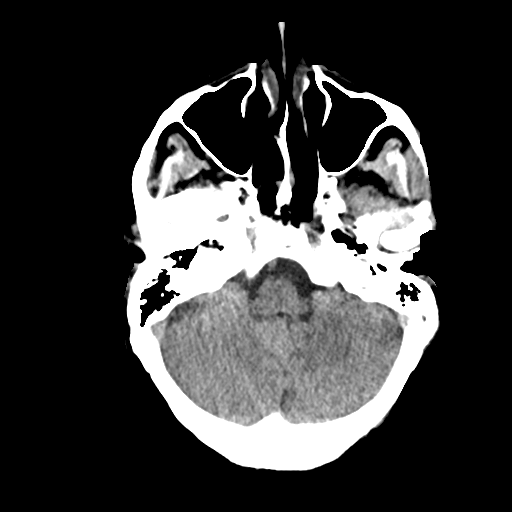
[im 13/33  brain]
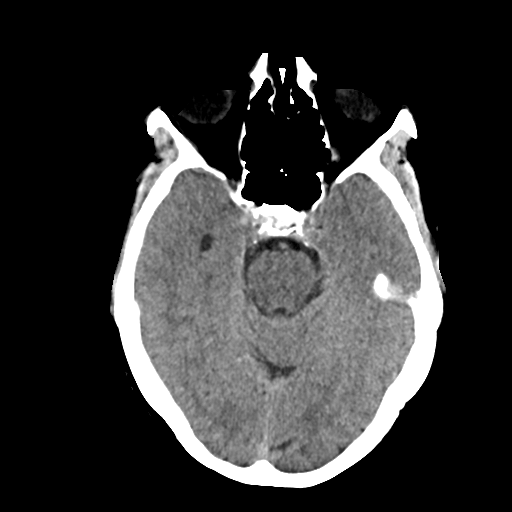
[im 17/33  brain]
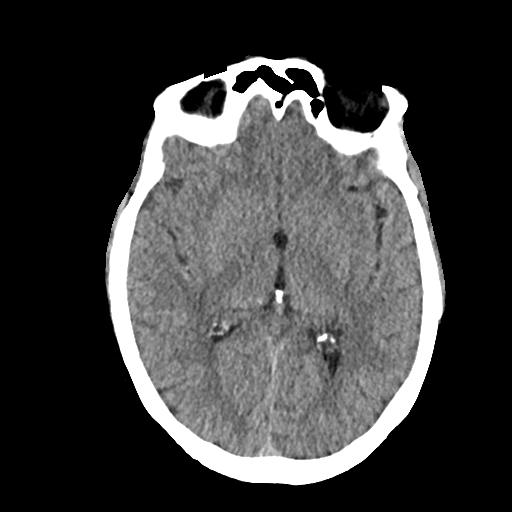
[im 21/33  brain]
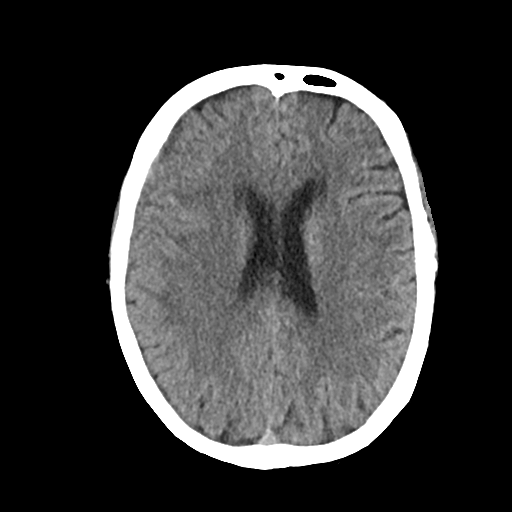
[im 21/33  bone]
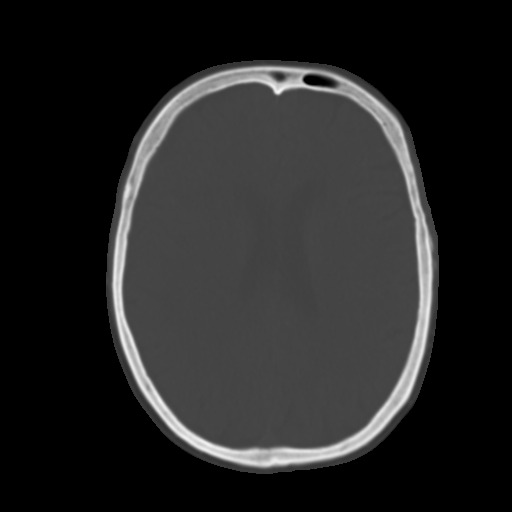
[im 25/33  brain]
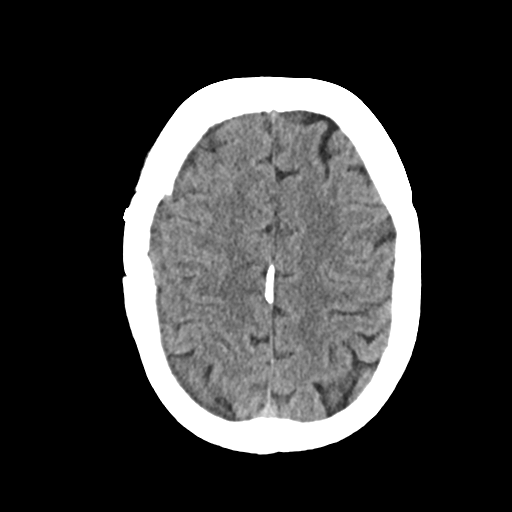
[im 29/33  brain]
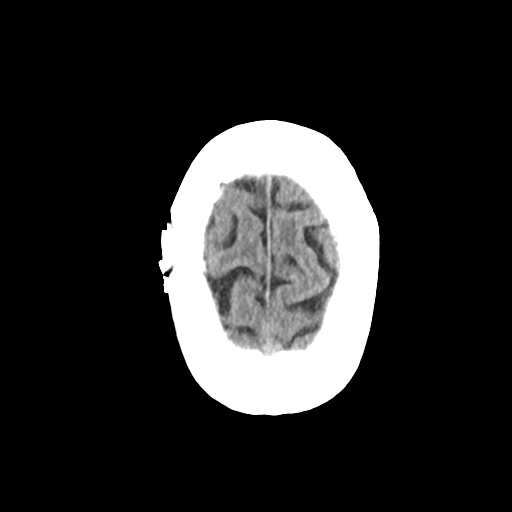

[Series 3: head 2.00 hr60 s3 axial bone · axial · 0.41mm/px · z∈[-623,-591]mm · 3 of 83 slices shown]
[im 9/83  bone]
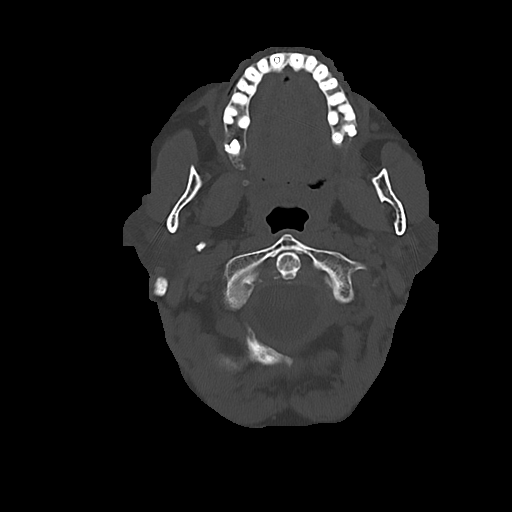
[im 17/83  bone]
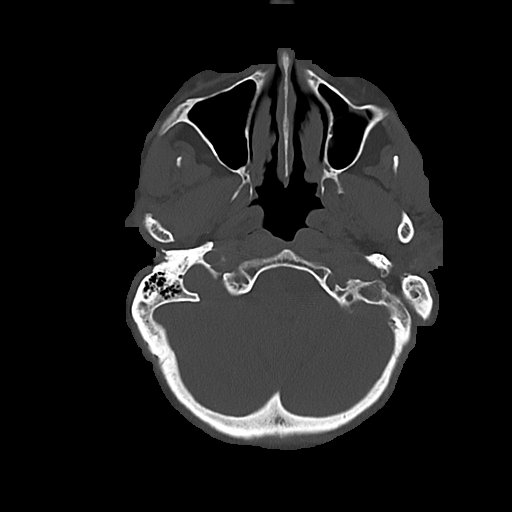
[im 25/83  bone]
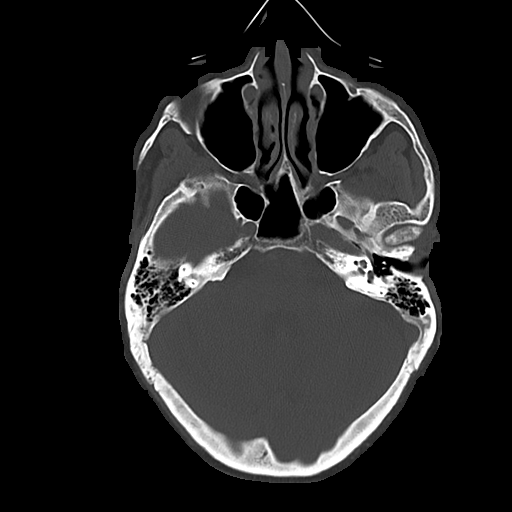

[Series 4: head 3.00 hr40 s3 sag · sagittal · 0.33mm/px · 3 of 65 slices shown]
[im 22/65  brain]
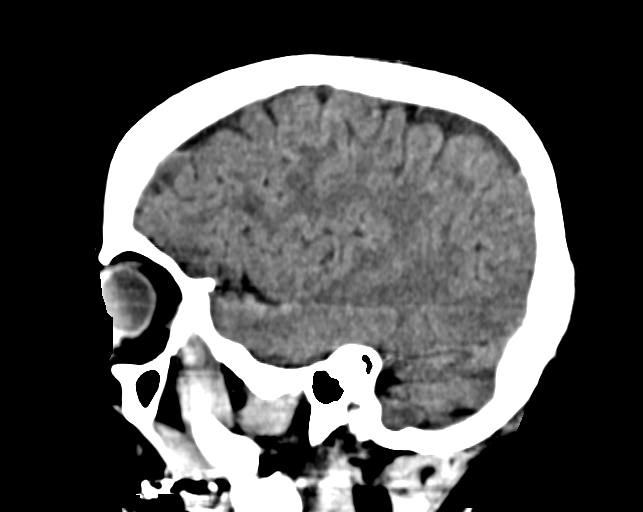
[im 33/65  brain]
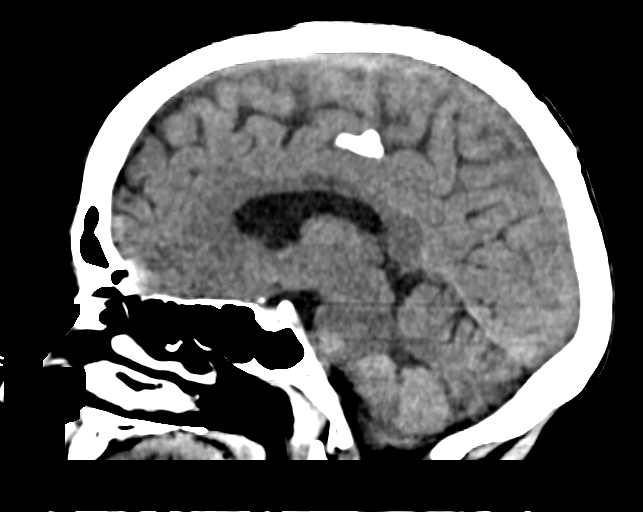
[im 43/65  brain]
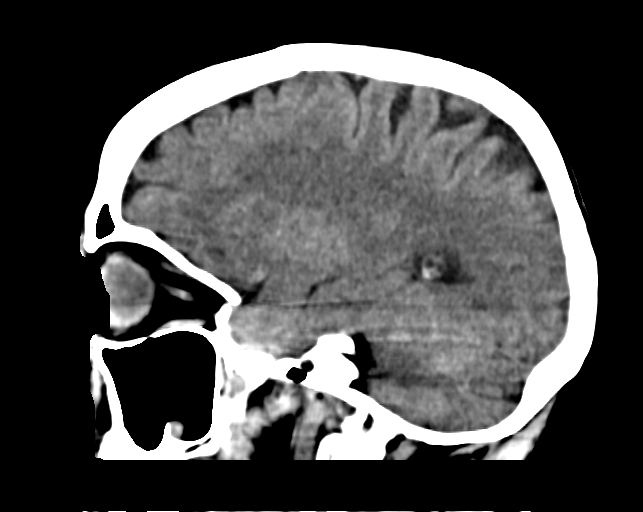

[Series 6: head 3.00 hr40 s3 cor · coronal · 0.33mm/px · 3 of 69 slices shown]
[im 23/69  brain]
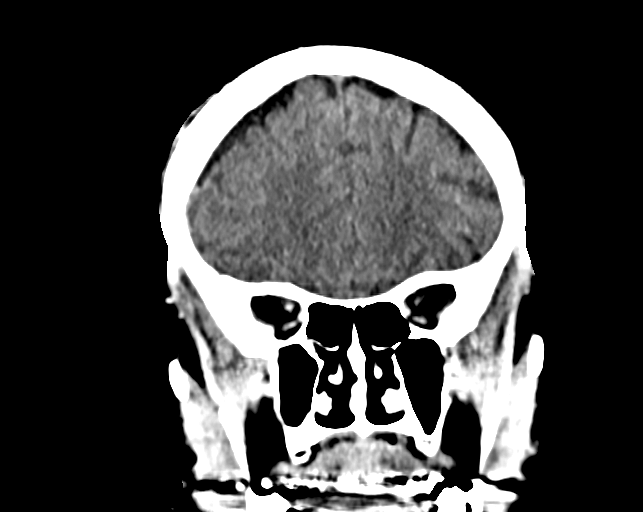
[im 31/69  brain]
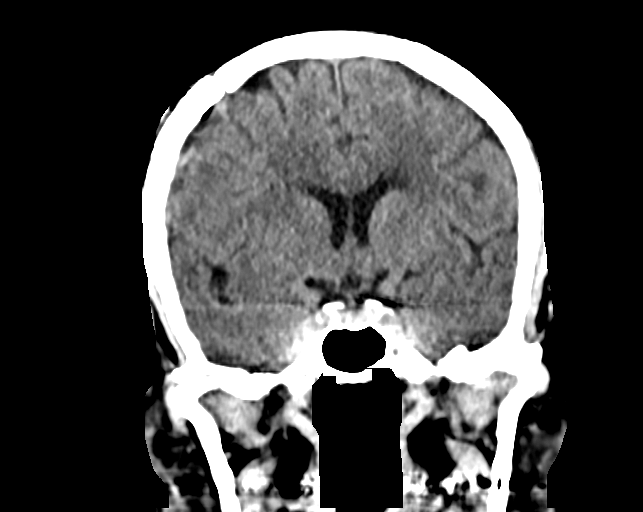
[im 38/69  brain]
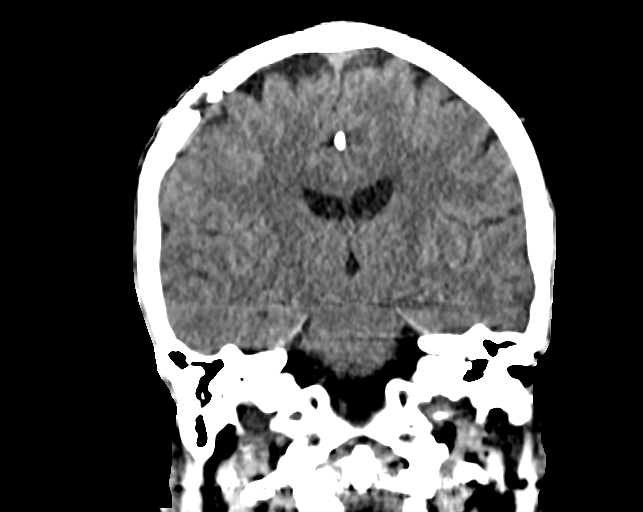

[16 of 47 positions shown; findings below may reference images not displayed]

FINDINGS: Brain: Substantially reduced right convexity subdural hematoma. Mass
effect has resolved. There is no acute intracranial hemorrhage.
Gray-white differentiation is preserved. Ventricles and sulci are
within normal limits in size and configuration. Stable findings of
probable mild chronic microvascular ischemic changes in the cerebral
white matter.

Vascular: There is atherosclerotic calcification at the skull base.

Skull: Right craniotomy.

Sinuses/Orbits: No acute finding.

Other: None.
IMPRESSION: Nearly resolved right cerebral convexity subdural hematoma. Mass
effect has resolved. No acute abnormality.
